# Patient Record
Sex: Female | Born: 1965 | Race: White | Hispanic: No | Marital: Married | Smoking: Never smoker
Health system: Southern US, Community
[De-identification: ages and names within clinical notes are randomized; demographics above are authoritative.]

## PROBLEM LIST (undated history)

## (undated) DIAGNOSIS — G4700 Insomnia, unspecified: Secondary | ICD-10-CM

## (undated) DIAGNOSIS — N390 Urinary tract infection, site not specified: Secondary | ICD-10-CM

## (undated) DIAGNOSIS — J189 Pneumonia, unspecified organism: Secondary | ICD-10-CM

## (undated) DIAGNOSIS — A6004 Herpesviral vulvovaginitis: Secondary | ICD-10-CM

## (undated) DIAGNOSIS — K219 Gastro-esophageal reflux disease without esophagitis: Secondary | ICD-10-CM

## (undated) DIAGNOSIS — M1990 Unspecified osteoarthritis, unspecified site: Secondary | ICD-10-CM

## (undated) DIAGNOSIS — E8881 Metabolic syndrome: Secondary | ICD-10-CM

## (undated) DIAGNOSIS — K649 Unspecified hemorrhoids: Secondary | ICD-10-CM

## (undated) DIAGNOSIS — E88819 Insulin resistance, unspecified: Secondary | ICD-10-CM

## (undated) DIAGNOSIS — M1710 Unilateral primary osteoarthritis, unspecified knee: Secondary | ICD-10-CM

## (undated) DIAGNOSIS — Z87442 Personal history of urinary calculi: Secondary | ICD-10-CM

## (undated) DIAGNOSIS — I10 Essential (primary) hypertension: Secondary | ICD-10-CM

## (undated) DIAGNOSIS — E7800 Pure hypercholesterolemia, unspecified: Secondary | ICD-10-CM

## (undated) DIAGNOSIS — C499 Malignant neoplasm of connective and soft tissue, unspecified: Secondary | ICD-10-CM

## (undated) DIAGNOSIS — M179 Osteoarthritis of knee, unspecified: Secondary | ICD-10-CM

## (undated) DIAGNOSIS — F419 Anxiety disorder, unspecified: Secondary | ICD-10-CM

## (undated) HISTORY — DX: Osteoarthritis of knee, unspecified: M17.9

## (undated) HISTORY — PX: CARPAL TUNNEL RELEASE: SHX101

## (undated) HISTORY — DX: Gastro-esophageal reflux disease without esophagitis: K21.9

## (undated) HISTORY — DX: Unspecified hemorrhoids: K64.9

## (undated) HISTORY — DX: Metabolic syndrome: E88.81

## (undated) HISTORY — DX: Anxiety disorder, unspecified: F41.9

## (undated) HISTORY — DX: Insomnia, unspecified: G47.00

## (undated) HISTORY — DX: Unspecified osteoarthritis, unspecified site: M19.90

## (undated) HISTORY — PX: TONSILLECTOMY: SUR1361

## (undated) HISTORY — DX: Essential (primary) hypertension: I10

## (undated) HISTORY — DX: Herpesviral vulvovaginitis: A60.04

## (undated) HISTORY — DX: Malignant neoplasm of connective and soft tissue, unspecified: C49.9

## (undated) HISTORY — DX: Unilateral primary osteoarthritis, unspecified knee: M17.10

## (undated) HISTORY — DX: Pure hypercholesterolemia, unspecified: E78.00

## (undated) HISTORY — DX: Urinary tract infection, site not specified: N39.0

## (undated) HISTORY — DX: Insulin resistance, unspecified: E88.819

---

## 1999-07-29 ENCOUNTER — Other Ambulatory Visit: Admission: RE | Admit: 1999-07-29 | Discharge: 1999-07-29 | Payer: Self-pay | Admitting: Obstetrics and Gynecology

## 2000-01-24 ENCOUNTER — Other Ambulatory Visit: Admission: RE | Admit: 2000-01-24 | Discharge: 2000-01-24 | Payer: Self-pay | Admitting: Obstetrics and Gynecology

## 2001-02-01 ENCOUNTER — Other Ambulatory Visit: Admission: RE | Admit: 2001-02-01 | Discharge: 2001-02-01 | Payer: Self-pay | Admitting: Obstetrics and Gynecology

## 2001-04-09 ENCOUNTER — Encounter: Payer: Self-pay | Admitting: Urology

## 2001-04-09 ENCOUNTER — Encounter: Admission: RE | Admit: 2001-04-09 | Discharge: 2001-04-09 | Payer: Self-pay | Admitting: Urology

## 2001-05-02 ENCOUNTER — Encounter: Payer: Self-pay | Admitting: Urology

## 2001-05-02 ENCOUNTER — Ambulatory Visit (HOSPITAL_COMMUNITY): Admission: RE | Admit: 2001-05-02 | Discharge: 2001-05-02 | Payer: Self-pay | Admitting: Urology

## 2001-05-16 ENCOUNTER — Encounter: Payer: Self-pay | Admitting: Urology

## 2001-05-16 ENCOUNTER — Ambulatory Visit (HOSPITAL_COMMUNITY): Admission: RE | Admit: 2001-05-16 | Discharge: 2001-05-16 | Payer: Self-pay | Admitting: Urology

## 2009-07-23 ENCOUNTER — Ambulatory Visit (HOSPITAL_BASED_OUTPATIENT_CLINIC_OR_DEPARTMENT_OTHER): Admission: RE | Admit: 2009-07-23 | Discharge: 2009-07-23 | Payer: Self-pay | Admitting: Orthopedic Surgery

## 2009-09-03 ENCOUNTER — Ambulatory Visit (HOSPITAL_BASED_OUTPATIENT_CLINIC_OR_DEPARTMENT_OTHER): Admission: RE | Admit: 2009-09-03 | Discharge: 2009-09-03 | Payer: Self-pay | Admitting: Orthopedic Surgery

## 2010-07-17 HISTORY — PX: APPENDECTOMY: SHX54

## 2010-10-02 LAB — BASIC METABOLIC PANEL
BUN: 11 mg/dL (ref 6–23)
CO2: 27 mEq/L (ref 19–32)
Calcium: 9.2 mg/dL (ref 8.4–10.5)
Chloride: 101 mEq/L (ref 96–112)
Creatinine, Ser: 0.8 mg/dL (ref 0.4–1.2)
GFR calc Af Amer: >60 mL/min (ref >60)
GFR calc non Af Amer: >60 mL/min (ref >60)
Glucose, Bld: 125 mg/dL — ABNORMAL HIGH (ref 70–99)
Potassium: 4.4 mEq/L (ref 3.5–5.1)
Sodium: 137 mEq/L (ref 135–145)

## 2010-10-02 LAB — POCT HEMOGLOBIN-HEMACUE: Hemoglobin: 12.1 g/dL (ref 12.0–15.0)

## 2010-10-07 LAB — POCT I-STAT, CHEM 8
BUN: 11 mg/dL (ref 6–23)
Calcium, Ion: 1.18 mmol/L (ref 1.12–1.32)
Chloride: 105 mEq/L (ref 96–112)
Creatinine, Ser: 0.8 mg/dL (ref 0.4–1.2)
Glucose, Bld: 103 mg/dL — ABNORMAL HIGH (ref 70–99)
HCT: 36 % (ref 36.0–46.0)
Hemoglobin: 12.2 g/dL (ref 12.0–15.0)
Potassium: 3.9 mEq/L (ref 3.5–5.1)
Sodium: 139 mEq/L (ref 135–145)
TCO2: 25 mmol/L (ref 0–100)

## 2015-01-13 HISTORY — PX: ESOPHAGOGASTRODUODENOSCOPY: SHX1529

## 2016-11-20 SURGERY — LITHOTRIPSY, ESWL
Anesthesia: LOCAL | Laterality: Left

## 2017-04-25 DIAGNOSIS — Z01818 Encounter for other preprocedural examination: Secondary | ICD-10-CM | POA: Diagnosis not present

## 2017-08-29 ENCOUNTER — Ambulatory Visit: Payer: Commercial Managed Care - PPO | Admitting: Allergy and Immunology

## 2017-08-29 ENCOUNTER — Encounter: Payer: Self-pay | Admitting: Allergy and Immunology

## 2017-08-29 VITALS — BP 148/90 | HR 80 | Temp 98.5°F | Resp 20 | Ht 62.3 in | Wt 207.8 lb

## 2017-08-29 DIAGNOSIS — R05 Cough: Secondary | ICD-10-CM

## 2017-08-29 DIAGNOSIS — K219 Gastro-esophageal reflux disease without esophagitis: Secondary | ICD-10-CM

## 2017-08-29 DIAGNOSIS — R059 Cough, unspecified: Secondary | ICD-10-CM

## 2017-08-29 MED ORDER — RANITIDINE HCL 300 MG PO TABS: 300.0000 mg | ORAL_TABLET | Freq: Every day | ORAL | 5 refills | Status: DC

## 2017-08-29 MED ORDER — METHYLPREDNISOLONE ACETATE 80 MG/ML IJ SUSP
80.0000 mg | Freq: Once | INTRAMUSCULAR | Status: AC
  Administered 2017-08-29: 80 mg via INTRAMUSCULAR

## 2017-08-29 MED ORDER — HYDROCOD POLST-CPM POLST ER 10-8 MG/5ML PO SUER: ORAL | 0 refills | Status: DC

## 2017-08-29 NOTE — Progress Notes (Signed)
Follow-up Note  Referring Provider: Ernestene Kiel, MD Primary Provider: Ernestene Kiel, MD Date of Office Visit: 08/29/2017  Subjective:   Brittany Lee (DOB: 04/17/1966) is a 52 y.o. female who returns to the [facility] [place] on 08/29/2017 in re-evaluation of the following:  HPI: Brittany Lee returns to this clinic in evaluation of her LPR and distant history of asthma.  She has not been seen in this clinic for several years.  Apparently she was doing very well without any significant problems but this past weekend she had a very significant reflux event with regurgitation and burning in her chest and burning in her throat leading her to develop an unrelenting coughing episode with lots of throat clearing that has not cleared.  She does not have any associated fever or ugly sputum production or chest pain or significant upper airway symptoms with this episode.  She uses Protonix 1 time per day to treat her reflux and occasionally will increase to twice a day for the most part she thought that she was doing very well with her condition until this event.  Allergies as of 08/29/2017      Reactions   Bupropion Other (See Comments)   "cried very easily"   Topiramate Other (See Comments)   "mental fogginess"   Sulfamethoxazole Rash      Medication List      ALPRAZolam 1 MG tablet Commonly known as:  XANAX   celecoxib 200 MG capsule Commonly known as:  CELEBREX   fenofibrate 145 MG tablet Commonly known as:  TRICOR   metFORMIN 500 MG 24 hr tablet Commonly known as:  GLUCOPHAGE-XR   pantoprazole 40 MG tablet Commonly known as:  PROTONIX   sertraline 50 MG tablet Commonly known as:  ZOLOFT   triamterene-hydrochlorothiazide 37.5-25 MG tablet Commonly known as:  MAXZIDE-25   zolpidem 12.5 MG CR tablet Commonly known as:  AMBIEN CR       Past Medical History:  Diagnosis Date  . Acid reflux   . Insulin resistance   . Osteoarthritis of knee     Past  Surgical History:  Procedure Laterality Date  . APPENDECTOMY  2012  . CARPAL TUNNEL RELEASE Bilateral   . [place]  . TONSILLECTOMY      Review of systems negative except as noted in HPI / PMHx or noted below:  Review of Systems  Constitutional: Negative.   HENT: Negative.   Eyes: Negative.   Respiratory: Negative.   Cardiovascular: Negative.   Gastrointestinal: Negative.   Genitourinary: Negative.   Musculoskeletal: Negative.   Skin: Negative.   Neurological: Negative.   Endo/Heme/Allergies: Negative.   Psychiatric/Behavioral: Negative.      Objective:   Vitals:   08/29/17 1525  BP: (!) 148/90  Pulse: 80  Resp: 20  Temp: 98.5 F (36.9 C)   Height: 5' 2.3" (158.2 cm)  Weight: 207 lb 12.8 oz (94.3 kg)   Physical Exam  Constitutional: She is well-developed, well-nourished, and in no distress.  Coughing  HENT:  Head: Normocephalic.  Right Ear: Tympanic membrane, external ear and ear canal normal.  Left Ear: Tympanic membrane, external ear and ear canal normal.  Nose: Nose normal. No mucosal edema or rhinorrhea.  Mouth/Throat: Uvula is midline, oropharynx is clear and moist and mucous membranes are normal. No oropharyngeal exudate.  Eyes: Conjunctivae are normal.  Neck: Trachea normal. No tracheal tenderness present. No tracheal deviation present. No thyromegaly present.  Cardiovascular: Normal rate, regular rhythm, S1  normal, S2 normal and normal heart sounds.  No murmur heard. Pulmonary/Chest: Breath sounds normal. No stridor. No respiratory distress. She has no wheezes. She has no rales.  Musculoskeletal: She exhibits no edema.  Lymphadenopathy:       Head (right side): No tonsillar adenopathy present.       Head (left side): No tonsillar adenopathy present.    She has no cervical adenopathy.  Neurological: She is alert. Gait normal.  Skin: No rash noted. She is not diaphoretic. No erythema. Nails show no clubbing.  Psychiatric: Mood and  affect normal.    Diagnostics: none  Assessment and Plan:   1. LPRD (laryngopharyngeal reflux disease)   2. Cough     1.  Increase treatment for reflux:   A.  Use Protonix 40 mg twice a day  B.  Add ranitidine 300 mg in evening  C.  Minimize use of caffeine and chocolate  2.  Treat inflammation:   A.  Depo-Medrol 80 IM delivered in clinic today  3.  If needed:   A.  Tussionex 2.5-5.0 mL's every 12 hours for cough.  NARCOTIC: 13ml  4.  Further evaluation and treatment?  I will assume that Liboria has developed a significant acid induced insult to her mid airway and treat her with therapy as mentioned above and she will keep in contact with me noting her response to this approach.  Further evaluation and treatment will be based upon her response.  Allena Katz, MD Allergy / Immunology [place]

## 2017-08-29 NOTE — Patient Instructions (Addendum)
  1.  Increase treatment for reflux:   A.  Use Protonix 40 mg twice a day  B.  Add ranitidine 300 mg in evening  C.  Minimize use of caffeine and chocolate  2.  Treat inflammation:   A.  Depo-Medrol 80 IM delivered in clinic today  3.  If needed:   A.  Tussionex 2.5-5.0 mL's every 12 hours for cough.  NARCOTIC: 13ml  4.  Further evaluation and treatment?

## 2017-08-30 ENCOUNTER — Encounter: Payer: Self-pay | Admitting: Allergy and Immunology

## 2017-09-10 ENCOUNTER — Telehealth: Payer: Self-pay | Admitting: *Deleted

## 2017-09-10 NOTE — Telephone Encounter (Signed)
Brittany Lee called to state that she is still having a pretty constant cough. There has been some improvement but it definitely has not gone away. She is taking the Protonix BID and Zantac QD as directed. She would like to know if she needs to give it more time or if there is anything else she should do/take. Please advise.

## 2017-09-10 NOTE — Telephone Encounter (Signed)
Please inform patient that we can give her prednisone 10 mg tablet once a day for an additional 10 days assuming that there is still some significant inflammation of her airway as a result of her insult that occurred several weeks ago.  As long as she continues to improve I would recommend she continue to utilize the plan established during her clinic visit.  If for some reason she gets worse or develops new symptoms then she should let us know and we may need to approach this issue differently.

## 2017-09-10 NOTE — Telephone Encounter (Signed)
Patient informed, I will leave Prednisone up front for her to pick up.

## 2017-11-02 HISTORY — PX: COLONOSCOPY: SHX174

## 2017-11-30 ENCOUNTER — Encounter: Payer: Self-pay | Admitting: Gastroenterology

## 2018-03-17 HISTORY — PX: REPLACEMENT TOTAL KNEE: SUR1224

## 2018-03-25 ENCOUNTER — Ambulatory Visit: Payer: Commercial Managed Care - PPO | Admitting: Allergy and Immunology

## 2018-03-25 ENCOUNTER — Encounter: Payer: Self-pay | Admitting: Allergy and Immunology

## 2018-03-25 VITALS — BP 138/84 | HR 80 | Resp 18

## 2018-03-25 DIAGNOSIS — K219 Gastro-esophageal reflux disease without esophagitis: Secondary | ICD-10-CM | POA: Diagnosis not present

## 2018-03-25 NOTE — Patient Instructions (Signed)
  1.  Continue treatment for reflux:   A.  Use Protonix 40 mg 1-2 times a day depending on reflux activity  B.  Add ranitidine 300 mg in evening when needed  C.  Minimize use of caffeine and chocolate  2.  Return to clinic in 1 year or earlier if problem

## 2018-03-25 NOTE — Progress Notes (Signed)
Follow-up Note  Referring Provider: Ernestene Kiel, MD Primary Provider: Ernestene Kiel, MD Date of Office Visit: 03/25/2018  Subjective:   Brittany Lee (DOB: 1966/04/14) is a 52 y.o. female who returns to the All[facility] and [place] on 03/25/2018 in re-evaluation of the following:  HPI: [place] returns to this clinic in reevaluation of her LPR and very distant history of asthma.  She was last seen in this clinic 29 August 2017.  Unfortunately she had a bad reflux episode about 2 weeks ago and subsequently developed coughing.  She immediately restarted her ranitidine in the evening and increased her Protonix to twice a day and she is much better at this point in time although still has a little bit of a lingering cough.  Overall though she is done very well on Protonix 1 time per day if she is careful about caffeine and chocolate consumption.  Allergies as of 03/25/2018      Reactions   Bupropion Other (See Comments)   "cried very easily"   Topiramate Other (See Comments)   "mental fogginess"   Sulfamethoxazole Rash      Medication List      acyclovir 400 MG tablet Commonly known as:  ZOVIRAX acyclovir 400 mg tablet  TAKE 1 TABLET TWICE A DAY   ALPRAZolam 1 MG tablet Commonly known as:  XANAX   fenofibrate 145 MG tablet Commonly known as:  TRICOR Take 145 mg by mouth daily.   metFORMIN 500 MG 24 hr tablet Commonly known as:  GLUCOPHAGE-XR Take 500 mg by mouth daily.   MULTIVITAMIN PO Take by mouth.   pantoprazole 40 MG tablet Commonly known as:  PROTONIX Take 40 mg by mouth 2 (two) times daily.   ranitidine 300 MG tablet Commonly known as:  ZANTAC Take 1 tablet (300 mg total) by mouth at bedtime.   sertraline 50 MG tablet Commonly known as:  ZOLOFT Take 50 mg by mouth daily.   traMADol 50 MG tablet Commonly known as:  ULTRAM   triamterene-hydrochlorothiazide 37.5-25 MG tablet Commonly known as:  MAXZIDE-25 as needed.   zolpidem 12.5 MG CR  tablet Commonly known as:  AMBIEN CR       Past Medical History:  Diagnosis Date  . Acid reflux   . Insulin resistance   . Osteoarthritis of knee     Past Surgical History:  Procedure Laterality Date  . APPENDECTOMY  2012  . CARPAL TUNNEL RELEASE Bilateral   . [place]  . TONSILLECTOMY      Review of systems negative except as noted in HPI / PMHx or noted below:  Review of Systems  Constitutional: Negative.   HENT: Negative.   Eyes: Negative.   Respiratory: Negative.   Cardiovascular: Negative.   Gastrointestinal: Negative.   Genitourinary: Negative.   Musculoskeletal: Negative.   Skin: Negative.   Neurological: Negative.   Endo/Heme/Allergies: Negative.   Psychiatric/Behavioral: Negative.      Objective:   Vitals:   03/25/18 1638  BP: 138/84  Pulse: 80  Resp: 18          Physical Exam  HENT:  Head: Normocephalic.  Right Ear: Tympanic membrane, external ear and ear canal normal.  Left Ear: Tympanic membrane, external ear and ear canal normal.  Nose: Nose normal. No mucosal edema or rhinorrhea.  Mouth/Throat: Uvula is midline, oropharynx is clear and moist and mucous membranes are normal. No oropharyngeal exudate.  Eyes: Conjunctivae are normal.  Neck: Trachea normal. No tracheal  tenderness present. No tracheal deviation present. No thyromegaly present.  Cardiovascular: Normal rate, regular rhythm, S1 normal, S2 normal and normal heart sounds.  No murmur heard. Pulmonary/Chest: Breath sounds normal. No stridor. No respiratory distress. She has no wheezes. She has no rales.  Musculoskeletal: She exhibits no edema.  Lymphadenopathy:       Head (right side): No tonsillar adenopathy present.       Head (left side): No tonsillar adenopathy present.    She has no cervical adenopathy.  Neurological: She is alert.  Skin: No rash noted. She is not diaphoretic. No erythema. Nails show no clubbing.    Diagnostics: none  Assessment and  Plan:   1. LPRD (laryngopharyngeal reflux disease)     1.  Continue treatment for reflux:   A.  Use Protonix 40 mg 1-2 times a day depending on reflux activity  B.  Add ranitidine 300 mg in evening when needed  C.  Minimize use of caffeine and chocolate  2.  Return to clinic in 1 year or earlier if problem   Charnese will probably do well as long as she is very careful about precipitating reflux episodes and uses aggressive therapy directed against reflux to address her LPR.  I will see her back in this clinic in 1 year or earlier if there is a problem.  Allena Katz, MD Allergy / Immunology [place]

## 2018-03-26 ENCOUNTER — Encounter: Payer: Self-pay | Admitting: Allergy and Immunology

## 2018-03-26 ENCOUNTER — Ambulatory Visit: Payer: Commercial Managed Care - PPO | Admitting: Allergy

## 2018-03-28 DIAGNOSIS — Z01818 Encounter for other preprocedural examination: Secondary | ICD-10-CM

## 2018-07-05 ENCOUNTER — Encounter: Payer: Self-pay | Admitting: Allergy

## 2018-07-05 ENCOUNTER — Ambulatory Visit: Payer: Commercial Managed Care - PPO | Admitting: Allergy

## 2018-07-05 VITALS — BP 128/94 | HR 96 | Temp 98.3°F | Resp 16 | Ht 63.0 in | Wt 204.4 lb

## 2018-07-05 DIAGNOSIS — K219 Gastro-esophageal reflux disease without esophagitis: Secondary | ICD-10-CM | POA: Insufficient documentation

## 2018-07-05 DIAGNOSIS — R05 Cough: Secondary | ICD-10-CM

## 2018-07-05 DIAGNOSIS — J069 Acute upper respiratory infection, unspecified: Secondary | ICD-10-CM | POA: Diagnosis not present

## 2018-07-05 DIAGNOSIS — R059 Cough, unspecified: Secondary | ICD-10-CM

## 2018-07-05 MED ORDER — HYDROCOD POLST-CPM POLST ER 10-8 MG/5ML PO SUER: ORAL | 0 refills | Status: DC

## 2018-07-05 MED ORDER — PANTOPRAZOLE SODIUM 40 MG PO TBEC: DELAYED_RELEASE_TABLET | ORAL | 0 refills | Status: DC

## 2018-07-05 NOTE — Patient Instructions (Addendum)
Take Tussionex 2.71ml to 5.4ml every 12 hours as needed for severe coughing.    Continue treatment for reflux:             A.  Use Protonix 40 mg 1-2 times a day depending on reflux activity             B.  Add ranitidine 300 mg in evening when needed             C.  Minimize use of caffeine and chocolate  Follow up in 1 year.   Drink plenty of fluids.  Water, juice, clear broth or warm lemon water are good choices. Avoid caffeine and alcohol, which can dehydrate you.  Eat chicken soup.  Chicken soup and other warm fluids can be soothing and loosen congestion.  Rest.  Adjust your room's temperature and humidity.  Keep your room warm but not overheated. If the air is dry, a cool-mist humidifier or vaporizer can moisten the air and help ease congestion and coughing. Keep the humidifier clean to prevent the growth of bacteria and molds.  Soothe your throat.  Perform a saltwater gargle. Dissolve one-quarter to a half teaspoon of salt in a 4- to 8-ounce glass of warm water. This can relieve a sore or scratchy throat temporarily.  Use saline nasal drops.  To help relieve nasal congestion, try saline nasal drops. You can buy these drops over the counter, and they can help relieve symptoms ? even in children.  Take over-the-counter cold and cough medications.  For adults and children older than 5, over-the-counter decongestants, antihistamines and pain relievers might offer some symptom relief. However, they won't prevent a cold or shorten its duration.

## 2018-07-05 NOTE — Assessment & Plan Note (Signed)
Patient most likely has a viral URI which is aggravating her coughing.  Unable to perform proper spirometry today due to coughing. Lungs clear on exam.   Gave hand out on viral URI  Take Tussionex 2.75ml to 5.25ml every 12 hours as needed for severe coughing.

## 2018-07-05 NOTE — Assessment & Plan Note (Signed)
.   See assessment and plan as above. 

## 2018-07-05 NOTE — Assessment & Plan Note (Signed)
Had some flare a few weeks ago.  Use Protonix 40 mg 1-2 times a day depending on reflux activity  Add ranitidine 300 mg in evening when needed  Minimize use of caffeine and chocolate

## 2018-07-05 NOTE — Progress Notes (Signed)
Follow Up Note  RE: Brittany Lee MRN: [id number] DOB: 1966-06-02 Date of Office Visit: 07/05/2018  Referring provider: Ernestene Kiel, MD Primary care provider: Ernestene Kiel, MD  Chief Complaint: Cough; Gastroesophageal Reflux; and Nasal Congestion  History of Present Illness: I had the pleasure of seeing Brittany Lee for a follow up visit at the [facility] [place] [facility] [place] on 07/05/2018. She is a 52 y.o. female, who is being followed for LRPD and cough. Today she is here for new complaint of cough. Her previous allergy office visit was on 03/25/2018 with Dr. Neldon Mc.   A few weeks ago patient had really bad reflux at night and on Tuesday she started with scratchy throat, sinus congestion and cough. The cough is keeping her up at night. She wants tussionex as that is the only thing that has helped her cough at night.   Patient currently on Protonix 40mg  daily and takes it twice a day during flares. She is also taking ranitidine 300mg  daily.  She also started Flonase 1 spray twice a day this week with some benefit. Denies any fevers or chills. No sick contacts.   Assessment and Plan: [name] is a 52 y.o. female with: Cough Patient most likely has a viral URI which is aggravating her coughing.  Unable to perform proper spirometry today due to coughing. Lungs clear on exam.   Gave hand out on viral URI  Take Tussionex 2.75ml to 5.67ml every 12 hours as needed for severe coughing.  Viral upper respiratory infection See assessment and plan as above.   LPRD (laryngopharyngeal reflux disease) Had some flare a few weeks ago.  Use Protonix 40 mg 1-2 times a day depending on reflux activity  Add ranitidine 300 mg in evening when needed  Minimize use of caffeine and chocolate  Return in about 1 year (around 07/06/2019).  Meds ordered this encounter  Medications  . pantoprazole (PROTONIX) 40 MG tablet    Sig: One tablet twice a day x's 1 month for reflux    Dispense:  60  tablet    Refill:  0  . chlorpheniramine-HYDROcodone (TUSSIONEX PENNKINETIC ER) 10-8 MG/5ML SUER    Sig: Take 2.38ml to 5.24ml every 12 hours as needed for coughing.    Dispense:  60 mL    Refill:  0   Diagnostics: Spirometry:  Tracings reviewed. Her effort: It was hard to get consistent efforts and there is a question as to whether this reflects a maximal maneuver. FVC: 2.81L FEV1: 2.20L, 82% predicted FEV1/FVC ratio: 78% Interpretation: not able to fully interpret due to today's poor effort and coughing.  Please see scanned spirometry results for details.  Medication List:  Current Outpatient Medications  Medication Sig Dispense Refill  . acyclovir (ZOVIRAX) 400 MG tablet Take 400 mg by mouth.     . ALPRAZolam (XANAX) 1 MG tablet     . fenofibrate (TRICOR) 145 MG tablet Take 145 mg by mouth daily.     . hydrochlorothiazide (MICROZIDE) 12.5 MG capsule Take 12.5 mg by mouth daily.    . metFORMIN (GLUCOPHAGE-XR) 500 MG 24 hr tablet Take 500 mg by mouth daily.     . Multiple Vitamins-Minerals (MULTIVITAMIN PO) Take by mouth.    . pantoprazole (PROTONIX) 40 MG tablet Take 40 mg by mouth 2 (two) times daily.     . ranitidine (ZANTAC) 300 MG tablet Take 1 tablet (300 mg total) by mouth at bedtime. 30 tablet 5  . sertraline (ZOLOFT) 50 MG tablet Take 50  mg by mouth daily.     . traMADol (ULTRAM) 50 MG tablet     . zolpidem (AMBIEN CR) 12.5 MG CR tablet     . chlorpheniramine-HYDROcodone (TUSSIONEX PENNKINETIC ER) 10-8 MG/5ML SUER Take 2.42ml to 5.45ml every 12 hours as needed for coughing. 60 mL 0  . pantoprazole (PROTONIX) 40 MG tablet One tablet twice a day x's 1 month for reflux 60 tablet 0   No current facility-administered medications for this visit.    Allergies: Allergies  Allergen Reactions  . Bupropion Other (See Comments)    "cried very easily"  . Sulfa Antibiotics Other (See Comments)  . Topiramate Other (See Comments)    "mental fogginess"  . Sulfamethoxazole Rash   I  reviewed her past medical history, social history, family history, and environmental history and no significant changes have been reported from previous visit on 03/25/2018.  Review of Systems  Constitutional: Negative for appetite change, chills, fever and unexpected weight change.  HENT: Positive for congestion. Negative for rhinorrhea.   Eyes: Negative for itching.  Respiratory: Positive for cough. Negative for chest tightness, shortness of breath and wheezing.   Gastrointestinal: Negative for abdominal pain.  Skin: Negative for rash.  Neurological: Negative for headaches.   Objective: BP (!) 128/94 (BP Location: Left Arm, Patient Position: Sitting, Cuff Size: Normal)   Pulse 96   Temp 98.3 F (36.8 C) (Oral)   Resp 16   Ht 5\' 3"  (1.6 m)   Wt 204 lb 5.9 oz (92.7 kg)   SpO2 96%   BMI 36.20 kg/m  Body mass index is 36.2 kg/m. Physical Exam  Constitutional: She is oriented to person, place, and time. She appears well-developed and well-nourished.  HENT:  Head: Normocephalic and atraumatic.  Right Ear: External ear normal.  Left Ear: External ear normal.  Nose: Nose normal.  Mouth/Throat: Oropharynx is clear and moist.  Eyes: Conjunctivae and EOM are normal.  Neck: Neck supple.  Cardiovascular: Normal rate, regular rhythm and normal heart sounds. Exam reveals no gallop and no friction rub.  No murmur heard. Pulmonary/Chest: Effort normal and breath sounds normal. She has no wheezes. She has no rales.  Coughing during exam  Lymphadenopathy:    She has no cervical adenopathy.  Neurological: She is alert and oriented to person, place, and time.  Skin: Skin is warm. No rash noted.  Psychiatric: She has a normal mood and affect. Her behavior is normal.  Nursing note and vitals reviewed.  Previous notes and tests were reviewed. The plan was reviewed with the patient/family, and all questions/concerned were addressed.  It was my pleasure to see Brittany Lee today and participate in her  care. Please feel free to contact me with any questions or concerns.  Sincerely,  Rexene Alberts, DO Allergy & Immunology  [facility] office: [phone] [facility] office: [phone]

## 2018-07-08 ENCOUNTER — Ambulatory Visit: Payer: Self-pay | Admitting: Allergy

## 2018-07-12 ENCOUNTER — Ambulatory Visit: Payer: Self-pay | Admitting: Allergy

## 2018-10-04 ENCOUNTER — Other Ambulatory Visit: Payer: Self-pay | Admitting: General Surgery

## 2018-10-07 NOTE — Pre-Procedure Instructions (Signed)
Brittany Lee  10/07/2018      [facility] - [place], [place] - [address] [place] [address] Phone: [phone] Fax: [phone]    Your procedure is scheduled on 10/10/18 Thursday from 0730-1030 AM  Report to [facility] at 5:30 A.M.  Call this number if you have problems the morning of surgery:  [phone]   Remember:  Do not eat after midnight.  You may drink clear liquids until  0430 AM.  Clear liquids allowed are: Water, Juice (non-citric and without pulp), Carbonated beverages, Clear Tea, Black Coffee only, Plain Jell-O only, Gatorade and Plain Popsicles only. Please complete your PRE-SURGERY ENSURE that was provided to you, 2 bottles by 11pm night before surgery and 1 bottle  3 hours (0430 AM) day of surgery .  Please, if able, drink it in one setting. DO NOT SIP.    Take these medicines the morning of surgery with A SIP OF WATER :              Fenofibrate(TRICOR)              Pantoprazole (Protonix)              Sertraline(ZOLOFT)                As needed - Alprazolam (XANAX)  As of today, STOP taking any Aspirin (unless otherwise instructed by your surgeon), Aleve, Naproxen, Ibuprofen, Motrin, Advil, Goody's, BC's, all herbal medications, fish oil, and all vitamins.    How to Manage Your Diabetes Before and After Surgery  Why is it important to control my blood sugar before and after surgery? . Improving blood sugar levels before and after surgery helps healing and can limit problems. . A way of improving blood sugar control is eating a healthy diet by: o  Eating less sugar and carbohydrates o  Increasing activity/exercise o  Talking with your doctor about reaching your blood sugar goals . High blood sugars (greater than 180 mg/dL) can raise your risk of infections and slow your recovery, so you will need to focus on controlling your diabetes during the weeks before surgery. . Make sure that the doctor  who takes care of your diabetes knows about your planned surgery including the date and location.  How do I manage my blood sugar before surgery? . Check your blood sugar at least 4 times a day, starting 2 days before surgery, to make sure that the level is not too high or low. o Check your blood sugar the morning of your surgery when you wake up and every 2 hours until you get to the Short Stay unit. . If your blood sugar is less than 70 mg/dL, you will need to treat for low blood sugar: o Do not take insulin. o Treat a low blood sugar (less than 70 mg/dL) with  cup of clear juice (cranberry or apple), 4 glucose tablets, OR glucose gel. Recheck blood sugar in 15 minutes after treatment (to make sure it is greater than 70 mg/dL). If your blood sugar is not greater than 70 mg/dL on recheck, call [phone] o  for further instructions. . Report your blood sugar to the short stay nurse when you get to Short Stay.  . If you are admitted to the hospital after surgery: o Your blood sugar will be checked by the staff and you will probably be given insulin after surgery (instead of oral diabetes medicines) to make  sure you have good blood sugar levels. o The goal for blood sugar control after surgery is 80-180 mg/dL.  WHAT DO I DO ABOUT MY DIABETES MEDICATION?  Marland Kitchen Do not take oral diabetes medicines (pills) METFORMIN the morning of surgery.  If your CBG is greater than 220 mg/dL, you may take  of your sliding scale (correction) dose of insuli  Reviewed and Endorsed by [facility] Patient Education Committee, August 2015   Do not wear jewelry, make-up or nail polish.  Do not wear lotions, powders, or perfumes, or deodorant.  Do not shave 48 hours prior to surgery.  Men may shave face and neck.  Do not bring valuables to the hospital.  [facility] is not responsible for any belongings or valuables.  Contacts, dentures or bridgework may not be worn into surgery.  Leave your suitcase in the car.   After surgery it may be brought to your room.  For patients admitted to the hospital, discharge time will be determined by your treatment team.  Patients discharged the day of surgery will not be allowed to drive home.    Special instructions:   [place]- Preparing For Surgery  Before surgery, you can play an important role. Because skin is not sterile, your skin needs to be as free of germs as possible. You can reduce the number of germs on your skin by washing with CHG (chlorahexidine gluconate) Soap before surgery.  CHG is an antiseptic cleaner which kills germs and bonds with the skin to continue killing germs even after washing.    Oral Hygiene is also important to reduce your risk of infection.  Remember - BRUSH YOUR TEETH THE MORNING OF SURGERY WITH YOUR REGULAR TOOTHPASTE  Please do not use if you have an allergy to CHG or antibacterial soaps. If your skin becomes reddened/irritated stop using the CHG.  Do not shave (including legs and underarms) for at least 48 hours prior to first CHG shower. It is OK to shave your face.  Please follow these instructions carefully.   1. Shower the NIGHT BEFORE SURGERY and the MORNING OF SURGERY with CHG.   2. If you chose to wash your hair, wash your hair first as usual with your normal shampoo.  3. After you shampoo, rinse your hair and body thoroughly to remove the shampoo.  4. Use CHG as you would any other liquid soap. You can apply CHG directly to the skin and wash gently with a scrungie or a clean washcloth.   5. Apply the CHG Soap to your body ONLY FROM THE NECK DOWN.  Do not use on open wounds or open sores. Avoid contact with your eyes, ears, mouth and genitals (private parts). Wash Face and genitals (private parts)  with your normal soap.  6. Wash thoroughly, paying special attention to the area where your surgery will be performed.  7. Thoroughly rinse your body with warm water from the neck down.  8. DO NOT shower/wash with  your normal soap after using and rinsing off the CHG Soap.  9. Pat yourself dry with a CLEAN TOWEL.  10. Wear CLEAN PAJAMAS to bed the night before surgery, wear comfortable clothes the morning of surgery  11. Place CLEAN SHEETS on your bed the night of your first shower and DO NOT SLEEP WITH PETS.  Day of Surgery:  Do not apply any deodorants/lotions.  Please wear clean clothes to the hospital/surgery center.   Remember to brush your teeth WITH YOUR REGULAR TOOTHPASTE.  Please read over the following fact sheets that you were given. Pain Booklet and Surgical Site Infection Prevention

## 2018-10-08 ENCOUNTER — Encounter (HOSPITAL_COMMUNITY): Payer: Self-pay

## 2018-10-08 ENCOUNTER — Other Ambulatory Visit: Payer: Self-pay

## 2018-10-08 ENCOUNTER — Encounter (HOSPITAL_COMMUNITY)
Admission: RE | Admit: 2018-10-08 | Discharge: 2018-10-08 | Disposition: A | Discharge status: Home / Self Care | Payer: Commercial Managed Care - PPO | Source: Ambulatory Visit | Attending: General Surgery | Admitting: General Surgery

## 2018-10-08 DIAGNOSIS — Z01818 Encounter for other preprocedural examination: Secondary | ICD-10-CM | POA: Insufficient documentation

## 2018-10-08 HISTORY — DX: Pneumonia, unspecified organism: J18.9

## 2018-10-08 HISTORY — DX: Personal history of urinary calculi: Z87.442

## 2018-10-08 LAB — COMPREHENSIVE METABOLIC PANEL
ALK PHOS: 49 U/L (ref 38–126)
ALT: 20 U/L (ref 0–44)
AST: 19 U/L (ref 15–41)
Albumin: 4 g/dL (ref 3.5–5.0)
Anion gap: 5 (ref 5–15)
BUN: 15 mg/dL (ref 6–20)
CO2: 26 mmol/L (ref 22–32)
CREATININE: 0.81 mg/dL (ref 0.44–1.00)
Calcium: 9.6 mg/dL (ref 8.9–10.3)
Chloride: 104 mmol/L (ref 98–111)
GFR calc non Af Amer: >60 mL/min (ref >60)
Glucose, Bld: 108 mg/dL — ABNORMAL HIGH (ref 70–99)
Potassium: 3.6 mmol/L (ref 3.5–5.1)
SODIUM: 135 mmol/L (ref 135–145)
Total Bilirubin: 0.7 mg/dL (ref 0.3–1.2)
Total Protein: 7.1 g/dL (ref 6.5–8.1)

## 2018-10-08 LAB — ABO/RH: ABO/RH(D): O POS

## 2018-10-08 LAB — CBC WITH DIFFERENTIAL/PLATELET
Abs Immature Granulocytes: 0.02 10*3/uL (ref 0.00–0.07)
Basophils Absolute: 0 10*3/uL (ref 0.0–0.1)
Basophils Relative: 1 %
Eosinophils Absolute: 0.1 10*3/uL (ref 0.0–0.5)
Eosinophils Relative: 1 %
HCT: 36.4 % (ref 36.0–46.0)
Hemoglobin: 11.4 g/dL — ABNORMAL LOW (ref 12.0–15.0)
Immature Granulocytes: 0 %
Lymphocytes Relative: 20 %
Lymphs Abs: 1.3 10*3/uL (ref 0.7–4.0)
MCH: 28 pg (ref 26.0–34.0)
MCHC: 31.3 g/dL (ref 30.0–36.0)
MCV: 89.4 fL (ref 80.0–100.0)
Monocytes Absolute: 0.6 10*3/uL (ref 0.1–1.0)
Monocytes Relative: 9 %
NEUTROS ABS: 4.6 10*3/uL (ref 1.7–7.7)
Neutrophils Relative %: 69 %
Platelets: 392 10*3/uL (ref 150–400)
RBC: 4.07 MIL/uL (ref 3.87–5.11)
RDW: 12.3 % (ref 11.5–15.5)
WBC: 6.6 10*3/uL (ref 4.0–10.5)
nRBC: 0 % (ref 0.0–0.2)

## 2018-10-08 LAB — TYPE AND SCREEN
ABO/RH(D): O POS
Antibody Screen: NEGATIVE

## 2018-10-08 LAB — PROTIME-INR
INR: 1 (ref 0.8–1.2)
Prothrombin Time: 13.4 seconds (ref 11.4–15.2)

## 2018-10-08 LAB — GLUCOSE, CAPILLARY: Glucose-Capillary: 103 mg/dL — ABNORMAL HIGH (ref 70–99)

## 2018-10-08 NOTE — Progress Notes (Addendum)
PCP - Prochnal  Cardiologist - denies  Chest x-ray - NA  EKG - 10/08/18 (E)  Stress Test - Denies  ECHO - denies  Cardiac Cath - Denies  AICD-Denies PM-Denies LOOP-Denies  Denies CPAP - NA  LABS-CBC,CMP,INR,T/S,UA    Urine Preg DOS  ASA-NA  HA1C- Fasting Blood Sugar -  Checks Blood Sugar _0____ times a day. 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 Does not check her bld. sugar at home. Stopped taking Metformin 1 wk back. A1C tested on 10/02/18 was 5.9.(lab values pulled up on her phone, verified by [facility]) Pt. Will bring copy of her A1C on DOS  Anesthesia-Y (Abnormal EKG. No cardiac history prior)  Pt denies having chest pain, sob, or fever at this time. All instructions explained to the pt, with a verbal understanding of the material. Pt agrees to go over the instructions while at home for a better understanding. The opportunity to ask questions was provided.

## 2018-10-08 NOTE — Progress Notes (Signed)

## 2018-10-08 NOTE — Progress Notes (Signed)
Verbal ok given to RN by Dr. Barry Dienes for patient to take 2 bottles of pre-surgical Ensure the day before and 1 bottle of Ensure on the day of surgery . Dr Barry Dienes informed of pt. having been on Metformin with last dose taken 1 wk back.  Pt. Had A1C drawn on 10/02/18 at PCP office. It was 5.9. Pt. Informed RN she will bring a copy of her A1C on DOS.

## 2018-10-09 MED ORDER — CEFOTETAN DISODIUM-DEXTROSE 2-2.08 GM-%(50ML) IV SOLR
2.0000 g | INTRAVENOUS | Status: AC
  Administered 2018-10-10: 2 g via INTRAVENOUS
  Filled 2018-10-09: qty 50

## 2018-10-09 NOTE — H&P (Signed)
Brittany Lee Documented: 10/04/2018 10:45 AM Location: [facility] Patient #: [id number] DOB: 05/10/1966 Single / Language: Brittany Lee / Race: White Female   History of Present Illness Brittany Klein MD; 10/04/2018 1:24 PM) The patient is a 53 year old female who presents with an abdominal mass. Patient is a lovely 53 year old female referred by Dr. Bobby Lee in [place] for consultation regarding a retroperitoneal mass. The patient presented to her primary care doctor with abdominal distention and early satiety. This has been going on retrospectively for around 3 months, but did not get significant until the last month. She first underwent CT scan which showed a large mass in the left abdomen and pelvis. On CT, this was felt to possibly arise from the ovary. It has solid elements as well as fat. It mainly appears to displace the bowel anteriorly and the kidney posteriorly. It was felt to be potentially malignant.  This was then followed up with MRI showing the mass to be 23 x 14 x 21 cm. It was very similar in terms of the displacement of the adjacent organs. 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 The patient did actually have a CT scan in May 2017. There was nothing similar in that location 3 years ago. She did have a staging scan of the chest which was negative for metastatic disease. The patient denies any significant pain. She has not really lost weight despite decrease in appetite because of the bloating in her abdomen.  The scans and images are reviewed in the canopy system as well as the reports. These are discussed with the patient as well as Dr. Bobby Lee.   Past Surgical History Brittany Lee, CMA; 10/04/2018 10:45 AM) Appendectomy  Cesarean Section - Multiple  Knee Surgery  Right. Tonsillectomy   Diagnostic Studies History Brittany Lee, CMA; 10/04/2018 10:45 AM) Colonoscopy  within last year Mammogram  within last year Pap Smear  1-5  years ago  Allergies Brittany Lee, CMA; 10/04/2018 10:46 AM) Sulfa Drugs  Allergies Reconciled   Medication History Brittany Lee, CMA; 10/04/2018 10:49 AM) Pantoprazole Sodium (40MG  Tablet DR, Oral) Active. ALPRAZolam (1MG  Tablet, Oral) Active. Acyclovir (400MG  Tablet, Oral) Active. Ambien (10MG  Tablet, Oral) Active. Lasix (20MG  Tablet, Oral) Active. Protonix (40MG  Packet, Oral) Active. Tricor (145MG  Tablet, Oral) Active. Xanax (1MG  Tablet, Oral) Active. hydroCHLOROthiazide (12.5MG  Tablet, Oral) Active. metFORMIN HCl (500MG  Tablet, Oral) Active. Medications Reconciled  Social History Brittany Lee, CMA; 10/04/2018 10:45 AM) Alcohol use  Occasional alcohol use. Caffeine use  Carbonated beverages. No drug use  Tobacco use  Never smoker.  Family History Brittany Lee, Brittany Lee; 10/04/2018 10:45 AM) Breast Cancer  Mother. Cerebrovascular Accident  Father. Diabetes Mellitus  Father. Hypertension  Father. Thyroid problems  Mother.  Pregnancy / Birth History Brittany Lee, [place]; 10/04/2018 10:45 AM) Age at menarche  26 years. Age of menopause  64-50 Contraceptive History  Intrauterine device, Oral contraceptives. Gravida  2 Irregular periods  Maternal age  66-30 Para  2  Other Problems Brittany Lee, CMA; 10/04/2018 10:45 AM) Arthritis  Gastroesophageal Reflux Disease  Kidney Stone     Review of Systems Brittany Lee CMA; 10/04/2018 10:45 AM) General Not Present- Appetite Loss, Chills, Fatigue, Fever, Night Sweats, Weight Gain and Weight Loss. Skin Not Present- Change in Wart/Mole, Dryness, Hives, Jaundice, New Lesions, Non-Healing Wounds, Rash and Ulcer. HEENT Not Present- Earache, Hearing Loss, Hoarseness, Nose Bleed, Oral Ulcers, Ringing in the Ears, Seasonal Allergies, Sinus Pain, Sore Throat, Visual Disturbances, Wears glasses/contact lenses and Yellow  Eyes. Respiratory Not Present- Bloody sputum, Chronic Cough, Difficulty Breathing,  Snoring and Wheezing. Breast Not Present- Breast Mass, Breast Pain, Nipple Discharge and Skin Changes. Cardiovascular Present- Shortness of Breath. Not Present- Chest Pain, Difficulty Breathing Lying Down, Leg Cramps, Palpitations, Rapid Heart Rate and Swelling of Extremities. Gastrointestinal Present- Bloating, Gets full quickly at meals and Indigestion. Not Present- Abdominal Pain, Bloody Stool, Change in Bowel Habits, Chronic diarrhea, Constipation, Difficulty Swallowing, Excessive gas, Hemorrhoids, Nausea, Rectal Pain and Vomiting. Female Genitourinary Not Present- Frequency, Nocturia, Painful Urination, Pelvic Pain and Urgency. Musculoskeletal Present- Back Pain. Not Present- Joint Pain, Joint Stiffness, Muscle Pain, Muscle Weakness and Swelling of Extremities. Neurological Not Present- Decreased Memory, Fainting, Headaches, Numbness, Seizures, Tingling, Tremor, Trouble walking and Weakness. Psychiatric Not Present- Anxiety, Bipolar, Change in Sleep Pattern, Depression, Fearful and Frequent crying. Endocrine Not Present- Cold Intolerance, Excessive Hunger, Hair Changes, Heat Intolerance, Hot flashes and New Diabetes. Hematology Not Present- Blood Thinners, Easy Bruising, Excessive bleeding, Gland problems, HIV and Persistent Infections.  Vitals (Brittany Lee CMA; 10/04/2018 10:50 AM) 10/04/2018 10:49 AM Weight: 202.4 lb Height: 64in Body Surface Area: 1.97 m Body Mass Index: 34.74 kg/m  Temp.: 98.32F(Temporal)  Pulse: 109 (Regular)  P.OX: 98% (Room air) BP: 190/110 (Sitting, Left Arm, Standard)       Physical Exam Brittany Klein MD; 10/04/2018 1:25 PM) General Mental Status-Alert. General Appearance-Consistent with stated age. Hydration-Well hydrated. Voice-Normal.  Head and Neck Head-normocephalic, atraumatic with no lesions or palpable masses. Trachea-midline. Thyroid Gland Characteristics - normal size and consistency.  Eye Eyeball -  Bilateral-Extraocular movements intact. Sclera/Conjunctiva - Bilateral-No scleral icterus.  Chest and Lung Exam Chest and lung exam reveals -quiet, even and easy respiratory effort with no use of accessory muscles and on auscultation, normal breath sounds, no adventitious sounds and normal vocal resonance. Inspection Chest Wall - Normal. Back - normal.  Cardiovascular Cardiovascular examination reveals -normal heart sounds, regular rate and rhythm with no murmurs and normal pedal pulses bilaterally.  Abdomen Inspection Inspection of the abdomen reveals - No Hernias. Palpation/Percussion Palpation and Percussion of the abdomen reveal - Non Tender, No Rebound tenderness, No Rigidity (guarding) and No hepatosplenomegaly. Note: partially firm left abdomen. Significant abdominal distention. Auscultation Auscultation of the abdomen reveals - Bowel sounds normal.  Neurologic Neurologic evaluation reveals -alert and oriented x 3 with no impairment of recent or remote memory. Mental Status-Normal.  Musculoskeletal Global Assessment -Note: no gross deformities.  Normal Exam - Left-Upper Extremity Strength Normal and Lower Extremity Strength Normal. Normal Exam - Right-Upper Extremity Strength Normal and Lower Extremity Strength Normal.  Lymphatic Head & Neck  General Head & Neck Lymphatics: Bilateral - Description - Normal. Axillary  General Axillary Region: Bilateral - Description - Normal. Tenderness - Non Tender. Femoral & Inguinal  Generalized Femoral & Inguinal Lymphatics: Bilateral - Description - No Generalized lymphadenopathy.    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 I do recommend excision. I would not delay this more than 4 weeks based on the possibility of malignancy. The patient had a relatively rapid increase in abdominal size and  symptoms.  Based on the appearance on CT and MRI, I think that we will be able to take the mass without removing adjacent organs. However, the patient and her friend are informed that sometimes because of the blood supply we do end up having to take a piece of bowel or a part of the stomach or ovary.  I discussed that I would recommend a bowel prep because of that.  I discussed that principally the risks are bleeding, infection, damage to adjacent structures, risk of hernia at the incision, risk of additional procedures or surgeries being required. I discussed the risk of recurrent cancer. I discussed the risk of blood clot, heart or lung complications, or prolonged hospitalization.  I discussed that the patient will likely need at least 4 to 6 weeks off of work. However, I discussed that if her pain is controlled and she is able to concentrate and not on narcotics, that I would not object to her working from home earlier than that. I discussed that she will most likely need a minimum of 3 days in the hospital and that the endpoints that will need to be reached prior to her leaving her bowel function, oral pain control, and intake of adequate liquids and calories.  I did review that the incision would be in the midline and would be quite large in order to remove the large tumor.  60 min spent in evaluation, examination, counseling, and coordination of care. >50% spent in counseling. Current Plans Pt Education - [facility] Colon Bowel Prep 2018 ERAS/Miralax/Antibiotics Started Neomycin Sulfate 500 MG Oral Tablet, 2 (two) Tablet SEE NOTE, #6, 10/04/2018, No Refill. Local Order: TAKE TWO TABLETS AT 2 PM, 3 PM, AND 10 PM THE DAY PRIOR TO SURGERY Started Flagyl 500 MG Oral Tablet, 2 (two) Tablet SEE NOTE, #6, 10/04/2018, No Refill. Local Order: Take at 2pm, 3pm, and 10pm the day prior to your colon operation You are being scheduled for surgery- Our schedulers will call you.  You should hear from our office's  scheduling department within 5 working days about the location, date, and time of surgery. We try to make accommodations for patient's preferences in scheduling surgery, but sometimes the OR schedule or the surgeon's schedule prevents [place] from making those accommodations.  If you have not heard from our office [phone]) in 5 working days, call the office and ask for your surgeon's nurse.  If you have other questions about your diagnosis, plan, or surgery, call the office and ask for your surgeon's nurse.    Signed by Brittany Klein, MD (10/04/2018 1:31 PM)

## 2018-10-09 NOTE — Progress Notes (Signed)
Anesthesia Chart Review:  Case:  [id number] Date/Time:  10/10/18 0715   Procedure:  RESECTION OF RETROPERITONEAL MASS (N/A )   Anesthesia type:  General   Pre-op diagnosis:  RETROPERITONEAL MASS   Location:  [facility] OR ROOM 08 / [place] [facility]   Surgeon:  [name], MD      DISCUSSION: Patient is a 53 year old female scheduled for the above procedure. According to H&P, patient had a recent CT for evaluation of abdominal distention. CT showed a large mass in the left abdomen and pelvis. MRI was ordered "showing the mass to be 23 x 14 x 21 cm. It was very similar in terms of the displacement of the adjacent organs. 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  History includes never smoker, acid reflux, insulin resistance. BMI is consistent with obesity.   If no acute changes then I anticipate that she can proceed as planned. She will need a urine pregnancy test on the day of surgery.    VS: BP 122/60   Pulse 88   Temp 36.9 C (Oral)   Resp 18   Ht 5\' 4"  (1.626 m)   Wt 91.7 kg   SpO2 97%   BMI 34.69 kg/m   PROVIDERS: Ernestene Kiel, MD is PCP   LABS: Labs reviewed: Acceptable for surgery. Patient was able to pull up her 10/02/18 A1c results on her smart phone--result was 5.9. (all labs ordered are listed, but only abnormal results are displayed)  Labs Reviewed  GLUCOSE, CAPILLARY - Abnormal; Notable for the following components:      Result Value   Glucose-Capillary 103 (*)    All other components within normal limits  CBC WITH DIFFERENTIAL/PLATELET - Abnormal; Notable for the following components:   Hemoglobin 11.4 (*)    All other components within normal limits  COMPREHENSIVE METABOLIC PANEL - Abnormal; Notable for the following components:   Glucose, Bld 108 (*)    All other components within normal limits  PROTIME-INR  TYPE AND SCREEN  ABO/RH    Spirometry 07/05/18: FVC 2.81 (83%), FEV1 2.20 (82%), FEV1R 0.78 (98%).  FEF25-75 2.19 (82%).  Interpretation: Normal ventilatory function.    EKG: 10/08/18: Normal sinus rhythm Low voltage QRS No significant change since last tracing Confirmed by Kirk Ruths [phone]) on 10/08/2018 10:46:36 AM   CV: N/A   Past Medical History:  Diagnosis Date  . Acid reflux   . History of kidney stones   . Insulin resistance   . Osteoarthritis of knee   . Pneumonia     Past Surgical History:  Procedure Laterality Date  . APPENDECTOMY  2012  . CARPAL TUNNEL RELEASE Bilateral   . [place]  . TONSILLECTOMY      MEDICATIONS: . ALPRAZolam (XANAX) 1 MG tablet  . chlorpheniramine-HYDROcodone (TUSSIONEX PENNKINETIC ER) 10-8 MG/5ML SUER  . fenofibrate (TRICOR) 145 MG tablet  . hydrochlorothiazide (HYDRODIURIL) 25 MG tablet  . MEGARED OMEGA-3 KRILL OIL PO  . metFORMIN (GLUCOPHAGE-XR) 500 MG 24 hr tablet  . Multiple Vitamin (MULTIVITAMIN WITH MINERALS) TABS tablet  . pantoprazole (PROTONIX) 40 MG tablet  . ranitidine (ZANTAC) 300 MG tablet  . sertraline (ZOLOFT) 50 MG tablet  . zolpidem (AMBIEN CR) 12.5 MG CR tablet   No current facility-administered medications for this encounter.    Derrill Memo ON 10/10/2018] cefoTEtan in Dextrose 5% (CEFOTAN) IVPB 2 g    Myra Gianotti, PA-C Surgical Short Stay/Anesthesiology [facility] Phone [phone] [facility]  Phone [phone] 10/09/2018 10:40 AM

## 2018-10-09 NOTE — Anesthesia Preprocedure Evaluation (Addendum)
Anesthesia Evaluation  Patient identified by MRN, date of birth, ID band Patient awake    Reviewed: Allergy & Precautions, NPO status , Patient's Chart, lab work & pertinent test results  Airway Mallampati: III  TM Distance: >3 FB Neck ROM: Full    Dental no notable dental hx. (+) Teeth Intact, Dental Advisory Given   Pulmonary neg pulmonary ROS,    Pulmonary exam normal breath sounds clear to auscultation       Cardiovascular negative cardio ROS Normal cardiovascular exam Rhythm:Regular Rate:Normal  ECG: NSR, rate 82   Neuro/Psych PSYCHIATRIC DISORDERS negative neurological ROS     GI/Hepatic Neg liver ROS, GERD  Medicated and Controlled,  Endo/Other  Insulin resistance  Renal/GU negative Renal ROS     Musculoskeletal negative musculoskeletal ROS (+)   Abdominal (+) + obese,   Peds  Hematology negative hematology ROS (+)   Anesthesia Other Findings RETROPERITONEAL MASS  Reproductive/Obstetrics                           Anesthesia Physical Anesthesia Plan  ASA: II  Anesthesia Plan: General   Post-op Pain Management:    Induction: Intravenous  PONV Risk Score and Plan: 4 or greater and Ondansetron, Dexamethasone, Treatment may vary due to age or medical condition and Midazolam  Airway Management Planned: Oral ETT  Additional Equipment:   Intra-op Plan:   Post-operative Plan: Extubation in OR  Informed Consent: I have reviewed the patients History and Physical, chart, labs and discussed the procedure including the risks, benefits and alternatives for the proposed anesthesia with the patient or authorized representative who has indicated his/her understanding and acceptance.     Dental advisory given  Plan Discussed with: CRNA  Anesthesia Plan Comments: (PAT note written 10/09/2018 by Myra Gianotti, PA-C. )      Anesthesia Quick Evaluation

## 2018-10-10 ENCOUNTER — Inpatient Hospital Stay (HOSPITAL_COMMUNITY): Payer: Commercial Managed Care - PPO | Admitting: Vascular Surgery

## 2018-10-10 ENCOUNTER — Inpatient Hospital Stay (HOSPITAL_COMMUNITY)
Admission: RE | Admit: 2018-10-10 | Discharge: 2018-10-13 | DRG: 828 | Disposition: A | Discharge status: Home / Self Care | Payer: Commercial Managed Care - PPO | Attending: Orthopaedic Surgery | Admitting: Orthopaedic Surgery

## 2018-10-10 ENCOUNTER — Encounter (HOSPITAL_COMMUNITY)
Admission: RE | Disposition: A | Discharge status: Home / Self Care | Payer: Self-pay | Source: Home / Self Care | Attending: General Surgery

## 2018-10-10 ENCOUNTER — Encounter (HOSPITAL_COMMUNITY): Payer: Self-pay | Admitting: *Deleted

## 2018-10-10 ENCOUNTER — Inpatient Hospital Stay (HOSPITAL_COMMUNITY): Payer: Commercial Managed Care - PPO | Admitting: Anesthesiology

## 2018-10-10 ENCOUNTER — Other Ambulatory Visit: Payer: Self-pay | Admitting: General Surgery

## 2018-10-10 ENCOUNTER — Other Ambulatory Visit: Payer: Self-pay

## 2018-10-10 DIAGNOSIS — Z882 Allergy status to sulfonamides status: Secondary | ICD-10-CM

## 2018-10-10 DIAGNOSIS — E669 Obesity, unspecified: Secondary | ICD-10-CM | POA: Diagnosis present

## 2018-10-10 DIAGNOSIS — C480 Malignant neoplasm of retroperitoneum: Secondary | ICD-10-CM | POA: Diagnosis present

## 2018-10-10 DIAGNOSIS — Z87442 Personal history of urinary calculi: Secondary | ICD-10-CM | POA: Diagnosis not present

## 2018-10-10 DIAGNOSIS — Z6834 Body mass index (BMI) 34.0-34.9, adult: Secondary | ICD-10-CM | POA: Diagnosis not present

## 2018-10-10 DIAGNOSIS — K219 Gastro-esophageal reflux disease without esophagitis: Secondary | ICD-10-CM | POA: Diagnosis present

## 2018-10-10 DIAGNOSIS — Z888 Allergy status to other drugs, medicaments and biological substances status: Secondary | ICD-10-CM | POA: Diagnosis not present

## 2018-10-10 DIAGNOSIS — Z8249 Family history of ischemic heart disease and other diseases of the circulatory system: Secondary | ICD-10-CM | POA: Diagnosis not present

## 2018-10-10 DIAGNOSIS — Z833 Family history of diabetes mellitus: Secondary | ICD-10-CM | POA: Diagnosis not present

## 2018-10-10 DIAGNOSIS — M1990 Unspecified osteoarthritis, unspecified site: Secondary | ICD-10-CM | POA: Diagnosis present

## 2018-10-10 DIAGNOSIS — Z803 Family history of malignant neoplasm of breast: Secondary | ICD-10-CM | POA: Diagnosis not present

## 2018-10-10 DIAGNOSIS — R1900 Intra-abdominal and pelvic swelling, mass and lump, unspecified site: Secondary | ICD-10-CM | POA: Diagnosis present

## 2018-10-10 DIAGNOSIS — R1909 Other intra-abdominal and pelvic swelling, mass and lump: Secondary | ICD-10-CM | POA: Diagnosis present

## 2018-10-10 HISTORY — PX: RESECTION OF RETROPERITONEAL MASS: SHX6340

## 2018-10-10 HISTORY — PX: RETROPERITONEAL MASS EXCISION: SHX2342

## 2018-10-10 LAB — URINALYSIS, ROUTINE W REFLEX MICROSCOPIC
Bilirubin Urine: NEGATIVE
Glucose, UA: NEGATIVE mg/dL
Hgb urine dipstick: NEGATIVE
Ketones, ur: NEGATIVE mg/dL
Nitrite: NEGATIVE
PROTEIN: NEGATIVE mg/dL
Specific Gravity, Urine: 1.018 (ref 1.005–1.030)
pH: 5 (ref 5.0–8.0)

## 2018-10-10 LAB — GLUCOSE, CAPILLARY
GLUCOSE-CAPILLARY: 147 mg/dL — AB (ref 70–99)
Glucose-Capillary: 144 mg/dL — ABNORMAL HIGH (ref 70–99)
Glucose-Capillary: 126 mg/dL — ABNORMAL HIGH (ref 70–99)

## 2018-10-10 LAB — POCT PREGNANCY, URINE: Preg Test, Ur: NEGATIVE

## 2018-10-10 SURGERY — EXCISION, MASS, RETROPERITONEUM
Anesthesia: General | Site: Abdomen | Laterality: Bilateral

## 2018-10-10 MED ORDER — ALPRAZOLAM 0.5 MG PO TABS: 0.5000 mg | ORAL_TABLET | Freq: Three times a day (TID) | ORAL | Status: DC | PRN

## 2018-10-10 MED ORDER — ONDANSETRON HCL 4 MG/2ML IJ SOLN
INTRAMUSCULAR | Status: DC | PRN
  Administered 2018-10-10: 4 mg via INTRAVENOUS

## 2018-10-10 MED ORDER — ONDANSETRON HCL 4 MG/2ML IJ SOLN: 4.0000 mg | Freq: Four times a day (QID) | INTRAMUSCULAR | Status: DC | PRN

## 2018-10-10 MED ORDER — ENOXAPARIN SODIUM 40 MG/0.4ML ~~LOC~~ SOLN
40.0000 mg | SUBCUTANEOUS | Status: DC
  Administered 2018-10-11 – 2018-10-13 (×3): 40 mg via SUBCUTANEOUS
  Filled 2018-10-10 (×3): qty 0.4

## 2018-10-10 MED ORDER — CHLORHEXIDINE GLUCONATE CLOTH 2 % EX PADS: 6.0000 | MEDICATED_PAD | Freq: Once | CUTANEOUS | Status: DC

## 2018-10-10 MED ORDER — GABAPENTIN 300 MG PO CAPS
300.0000 mg | ORAL_CAPSULE | ORAL | Status: AC
  Administered 2018-10-10: 300 mg via ORAL
  Filled 2018-10-10: qty 1

## 2018-10-10 MED ORDER — PANTOPRAZOLE SODIUM 40 MG PO TBEC
40.0000 mg | DELAYED_RELEASE_TABLET | Freq: Every day | ORAL | Status: DC
  Administered 2018-10-11 – 2018-10-13 (×3): 40 mg via ORAL
  Filled 2018-10-10 (×3): qty 1

## 2018-10-10 MED ORDER — SODIUM CHLORIDE 0.9% FLUSH: 9.0000 mL | INTRAVENOUS | Status: DC | PRN

## 2018-10-10 MED ORDER — KETOROLAC TROMETHAMINE 30 MG/ML IJ SOLN
INTRAMUSCULAR | Status: AC
  Filled 2018-10-10: qty 1

## 2018-10-10 MED ORDER — HYDROMORPHONE 1 MG/ML IV SOLN
INTRAVENOUS | Status: DC
  Administered 2018-10-10: 2.7 mg via INTRAVENOUS
  Administered 2018-10-10 – 2018-10-11 (×3): 0.6 mg via INTRAVENOUS
  Administered 2018-10-11: 0.3 mg via INTRAVENOUS
  Administered 2018-10-11: 1.5 mg via INTRAVENOUS
  Administered 2018-10-12: 0.9 mg via INTRAVENOUS
  Administered 2018-10-12: 0 mg via INTRAVENOUS
  Filled 2018-10-10: qty 25

## 2018-10-10 MED ORDER — BUPIVACAINE-EPINEPHRINE (PF) 0.25% -1:200000 IJ SOLN
INTRAMUSCULAR | Status: AC
  Filled 2018-10-10: qty 30

## 2018-10-10 MED ORDER — LIDOCAINE 2% (20 MG/ML) 5 ML SYRINGE
INTRAMUSCULAR | Status: DC | PRN
  Administered 2018-10-10: 40 mg via INTRAVENOUS

## 2018-10-10 MED ORDER — SERTRALINE HCL 50 MG PO TABS
50.0000 mg | ORAL_TABLET | Freq: Every day | ORAL | Status: DC
  Administered 2018-10-11 – 2018-10-13 (×3): 50 mg via ORAL
  Filled 2018-10-10 (×3): qty 1

## 2018-10-10 MED ORDER — FENTANYL CITRATE (PF) 100 MCG/2ML IJ SOLN
INTRAMUSCULAR | Status: DC | PRN
  Administered 2018-10-10 (×2): 50 ug via INTRAVENOUS
  Administered 2018-10-10: 100 ug via INTRAVENOUS

## 2018-10-10 MED ORDER — DIPHENHYDRAMINE HCL 12.5 MG/5ML PO ELIX: 12.5000 mg | ORAL_SOLUTION | Freq: Four times a day (QID) | ORAL | Status: DC | PRN

## 2018-10-10 MED ORDER — HYDROMORPHONE 1 MG/ML IV SOLN
INTRAVENOUS | Status: DC
  Administered 2018-10-10: 1 mg via INTRAVENOUS
  Filled 2018-10-10: qty 25

## 2018-10-10 MED ORDER — NALOXONE HCL 0.4 MG/ML IJ SOLN: 0.4000 mg | INTRAMUSCULAR | Status: DC | PRN

## 2018-10-10 MED ORDER — SIMETHICONE 80 MG PO CHEW: 40.0000 mg | CHEWABLE_TABLET | Freq: Four times a day (QID) | ORAL | Status: DC | PRN

## 2018-10-10 MED ORDER — KETOROLAC TROMETHAMINE 30 MG/ML IJ SOLN
30.0000 mg | Freq: Once | INTRAMUSCULAR | Status: AC | PRN
  Administered 2018-10-10: 30 mg via INTRAVENOUS

## 2018-10-10 MED ORDER — HEMOSTATIC AGENTS (NO CHARGE) OPTIME
TOPICAL | Status: DC | PRN
  Administered 2018-10-10 (×2): 1 via TOPICAL

## 2018-10-10 MED ORDER — KCL IN DEXTROSE-NACL 20-5-0.45 MEQ/L-%-% IV SOLN
INTRAVENOUS | Status: DC
  Administered 2018-10-10 – 2018-10-12 (×6): via INTRAVENOUS
  Filled 2018-10-10 (×6): qty 1000

## 2018-10-10 MED ORDER — DEXAMETHASONE SODIUM PHOSPHATE 10 MG/ML IJ SOLN
INTRAMUSCULAR | Status: AC
  Filled 2018-10-10: qty 1

## 2018-10-10 MED ORDER — MIDAZOLAM HCL 5 MG/5ML IJ SOLN
INTRAMUSCULAR | Status: DC | PRN
  Administered 2018-10-10: 2 mg via INTRAVENOUS

## 2018-10-10 MED ORDER — LACTATED RINGERS IV SOLN
INTRAVENOUS | Status: DC | PRN
  Administered 2018-10-10 (×2): via INTRAVENOUS

## 2018-10-10 MED ORDER — LIDOCAINE 2% (20 MG/ML) 5 ML SYRINGE
INTRAMUSCULAR | Status: AC
  Filled 2018-10-10: qty 5

## 2018-10-10 MED ORDER — HYDROMORPHONE HCL 1 MG/ML IJ SOLN
0.2500 mg | INTRAMUSCULAR | Status: DC | PRN
  Administered 2018-10-10 (×2): 0.5 mg via INTRAVENOUS

## 2018-10-10 MED ORDER — OXYCODONE HCL 5 MG/5ML PO SOLN: 5.0000 mg | Freq: Once | ORAL | Status: DC | PRN

## 2018-10-10 MED ORDER — FENTANYL CITRATE (PF) 100 MCG/2ML IJ SOLN: INTRAMUSCULAR | Status: DC | PRN

## 2018-10-10 MED ORDER — SCOPOLAMINE 1 MG/3DAYS TD PT72
MEDICATED_PATCH | TRANSDERMAL | Status: AC
  Filled 2018-10-10: qty 1

## 2018-10-10 MED ORDER — MIDAZOLAM HCL 2 MG/2ML IJ SOLN
INTRAMUSCULAR | Status: AC
  Filled 2018-10-10: qty 2

## 2018-10-10 MED ORDER — BUPIVACAINE-EPINEPHRINE (PF) 0.25% -1:200000 IJ SOLN
INTRAMUSCULAR | Status: DC | PRN
  Administered 2018-10-10: 10 mL via PERINEURAL

## 2018-10-10 MED ORDER — DIPHENHYDRAMINE HCL 50 MG/ML IJ SOLN: 12.5000 mg | Freq: Four times a day (QID) | INTRAMUSCULAR | Status: DC | PRN

## 2018-10-10 MED ORDER — METOPROLOL TARTRATE 5 MG/5ML IV SOLN: 5.0000 mg | Freq: Four times a day (QID) | INTRAVENOUS | Status: DC | PRN

## 2018-10-10 MED ORDER — STERILE WATER FOR IRRIGATION IR SOLN
Status: DC | PRN
  Administered 2018-10-10: 1000 mL

## 2018-10-10 MED ORDER — LIDOCAINE HCL 1 % IJ SOLN
INTRAMUSCULAR | Status: AC
  Filled 2018-10-10: qty 20

## 2018-10-10 MED ORDER — TRAMADOL HCL 50 MG PO TABS: 50.0000 mg | ORAL_TABLET | Freq: Four times a day (QID) | ORAL | Status: DC | PRN

## 2018-10-10 MED ORDER — LACTATED RINGERS IV SOLN: INTRAVENOUS | Status: DC | PRN

## 2018-10-10 MED ORDER — CEFAZOLIN SODIUM-DEXTROSE 2-4 GM/100ML-% IV SOLN
2.0000 g | Freq: Three times a day (TID) | INTRAVENOUS | Status: AC
  Administered 2018-10-10: 2 g via INTRAVENOUS
  Filled 2018-10-10: qty 100

## 2018-10-10 MED ORDER — ONDANSETRON 4 MG PO TBDP: 4.0000 mg | ORAL_TABLET | Freq: Four times a day (QID) | ORAL | Status: DC | PRN

## 2018-10-10 MED ORDER — OXYCODONE HCL 5 MG PO TABS: 5.0000 mg | ORAL_TABLET | Freq: Once | ORAL | Status: DC | PRN

## 2018-10-10 MED ORDER — FAMOTIDINE 20 MG PO TABS
20.0000 mg | ORAL_TABLET | Freq: Every day | ORAL | Status: DC
  Administered 2018-10-10 – 2018-10-12 (×2): 20 mg via ORAL
  Filled 2018-10-10 (×3): qty 1

## 2018-10-10 MED ORDER — DOCUSATE SODIUM 100 MG PO CAPS
100.0000 mg | ORAL_CAPSULE | Freq: Two times a day (BID) | ORAL | Status: DC
  Administered 2018-10-10 – 2018-10-13 (×7): 100 mg via ORAL
  Filled 2018-10-10 (×7): qty 1

## 2018-10-10 MED ORDER — DEXAMETHASONE SODIUM PHOSPHATE 10 MG/ML IJ SOLN
INTRAMUSCULAR | Status: DC | PRN
  Administered 2018-10-10: 10 mg via INTRAVENOUS

## 2018-10-10 MED ORDER — ACETAMINOPHEN 325 MG PO TABS
650.0000 mg | ORAL_TABLET | Freq: Four times a day (QID) | ORAL | Status: DC | PRN
  Administered 2018-10-11 – 2018-10-13 (×3): 650 mg via ORAL
  Filled 2018-10-10 (×4): qty 2

## 2018-10-10 MED ORDER — LACTATED RINGERS IV SOLN
INTRAVENOUS | Status: DC | PRN
  Administered 2018-10-10: 08:00:00 via INTRAVENOUS

## 2018-10-10 MED ORDER — ZOLPIDEM TARTRATE 5 MG PO TABS
5.0000 mg | ORAL_TABLET | Freq: Every evening | ORAL | Status: DC | PRN
  Administered 2018-10-10: 5 mg via ORAL
  Filled 2018-10-10: qty 1

## 2018-10-10 MED ORDER — PROMETHAZINE HCL 25 MG/ML IJ SOLN: 6.2500 mg | INTRAMUSCULAR | Status: DC | PRN

## 2018-10-10 MED ORDER — FENOFIBRATE 160 MG PO TABS
160.0000 mg | ORAL_TABLET | Freq: Every day | ORAL | Status: DC
  Administered 2018-10-11 – 2018-10-13 (×3): 160 mg via ORAL
  Filled 2018-10-10 (×3): qty 1

## 2018-10-10 MED ORDER — METHOCARBAMOL 500 MG PO TABS
500.0000 mg | ORAL_TABLET | Freq: Four times a day (QID) | ORAL | Status: DC | PRN
  Administered 2018-10-11 – 2018-10-13 (×3): 500 mg via ORAL
  Filled 2018-10-10 (×3): qty 1

## 2018-10-10 MED ORDER — FENTANYL CITRATE (PF) 250 MCG/5ML IJ SOLN
INTRAMUSCULAR | Status: AC
  Filled 2018-10-10: qty 5

## 2018-10-10 MED ORDER — 0.9 % SODIUM CHLORIDE (POUR BTL) OPTIME
TOPICAL | Status: DC | PRN
  Administered 2018-10-10: 1000 mL

## 2018-10-10 MED ORDER — BUPIVACAINE 0.25 % ON-Q PUMP DUAL CATH 300 ML
300.0000 mL | INJECTION | Status: AC
  Administered 2018-10-10: 300 mL
  Filled 2018-10-10: qty 300

## 2018-10-10 MED ORDER — SCOPOLAMINE 1 MG/3DAYS TD PT72
MEDICATED_PATCH | TRANSDERMAL | Status: DC | PRN
  Administered 2018-10-10: 1 via TRANSDERMAL

## 2018-10-10 MED ORDER — PROPOFOL 10 MG/ML IV BOLUS
INTRAVENOUS | Status: DC | PRN
  Administered 2018-10-10: 160 mg via INTRAVENOUS

## 2018-10-10 MED ORDER — OXYCODONE HCL 5 MG PO TABS: 5.0000 mg | ORAL_TABLET | ORAL | Status: DC | PRN

## 2018-10-10 MED ORDER — PHENYLEPHRINE 40 MCG/ML (10ML) SYRINGE FOR IV PUSH (FOR BLOOD PRESSURE SUPPORT)
PREFILLED_SYRINGE | INTRAVENOUS | Status: DC | PRN
  Administered 2018-10-10: 120 ug via INTRAVENOUS
  Administered 2018-10-10 (×2): 80 ug via INTRAVENOUS
  Administered 2018-10-10: 120 ug via INTRAVENOUS

## 2018-10-10 MED ORDER — GABAPENTIN 300 MG PO CAPS
300.0000 mg | ORAL_CAPSULE | Freq: Two times a day (BID) | ORAL | Status: DC
  Administered 2018-10-10 – 2018-10-13 (×6): 300 mg via ORAL
  Filled 2018-10-10 (×6): qty 1

## 2018-10-10 MED ORDER — ACETAMINOPHEN 500 MG PO TABS
1000.0000 mg | ORAL_TABLET | ORAL | Status: AC
  Administered 2018-10-10: 1000 mg via ORAL
  Filled 2018-10-10: qty 2

## 2018-10-10 MED ORDER — ROCURONIUM BROMIDE 10 MG/ML (PF) SYRINGE
PREFILLED_SYRINGE | INTRAVENOUS | Status: DC | PRN
  Administered 2018-10-10: 50 mg via INTRAVENOUS
  Administered 2018-10-10: 30 mg via INTRAVENOUS

## 2018-10-10 MED ORDER — ONDANSETRON HCL 4 MG/2ML IJ SOLN
INTRAMUSCULAR | Status: AC
  Filled 2018-10-10: qty 2

## 2018-10-10 MED ORDER — INSULIN ASPART 100 UNIT/ML ~~LOC~~ SOLN
0.0000 [IU] | Freq: Three times a day (TID) | SUBCUTANEOUS | Status: DC
  Administered 2018-10-10: 2 [IU] via SUBCUTANEOUS

## 2018-10-10 MED ORDER — PROPOFOL 10 MG/ML IV BOLUS
INTRAVENOUS | Status: AC
  Filled 2018-10-10: qty 20

## 2018-10-10 MED ORDER — HYDROMORPHONE HCL 1 MG/ML IJ SOLN
INTRAMUSCULAR | Status: AC
  Filled 2018-10-10: qty 1

## 2018-10-10 MED ORDER — ACETAMINOPHEN 650 MG RE SUPP: 650.0000 mg | Freq: Four times a day (QID) | RECTAL | Status: DC | PRN

## 2018-10-10 MED ORDER — SUGAMMADEX SODIUM 200 MG/2ML IV SOLN
INTRAVENOUS | Status: DC | PRN
  Administered 2018-10-10: 200 mg via INTRAVENOUS

## 2018-10-10 MED ORDER — LIDOCAINE HCL 2 % IJ SOLN
INTRAMUSCULAR | Status: AC
  Filled 2018-10-10: qty 20

## 2018-10-10 MED ORDER — BUPIVACAINE ON-Q PAIN PUMP (FOR ORDER SET NO CHG)
INJECTION | Status: DC
  Filled 2018-10-10: qty 1

## 2018-10-10 MED ORDER — MAGNESIUM CITRATE PO SOLN: 1.0000 | Freq: Once | ORAL | Status: DC | PRN

## 2018-10-10 MED ORDER — BISACODYL 5 MG PO TBEC: 5.0000 mg | DELAYED_RELEASE_TABLET | Freq: Every day | ORAL | Status: DC | PRN

## 2018-10-10 SURGICAL SUPPLY — 61 items
BLADE CLIPPER SURG (BLADE) IMPLANT
CANISTER SUCT 3000ML PPV (MISCELLANEOUS) ×2 IMPLANT
CATH KIT ON-Q SILVERSOAK 7.5 (CATHETERS) IMPLANT
CATH KIT ON-Q SILVERSOAK 7.5IN (CATHETERS) ×2 IMPLANT
CLIP VESOCCLUDE LG 6/CT (CLIP) ×2 IMPLANT
CLIP VESOCCLUDE MED 6/CT (CLIP) ×1 IMPLANT
CLIP VESOLOCK LG 6/CT PURPLE (CLIP) ×2 IMPLANT
CLIP VESOLOCK MED 6/CT (CLIP) ×2 IMPLANT
CLIP VESOLOCK MED LG 6/CT (CLIP) ×1 IMPLANT
CLSR STERI-STRIP ANTIMIC 1/2X4 (GAUZE/BANDAGES/DRESSINGS) ×1 IMPLANT
COVER SURGICAL LIGHT HANDLE (MISCELLANEOUS) ×4 IMPLANT
COVER WAND RF STERILE (DRAPES) ×2 IMPLANT
DRSG COVADERM 4X14 (GAUZE/BANDAGES/DRESSINGS) ×1 IMPLANT
DRSG OPSITE POSTOP 4X10 (GAUZE/BANDAGES/DRESSINGS) IMPLANT
DRSG OPSITE POSTOP 4X8 (GAUZE/BANDAGES/DRESSINGS) IMPLANT
DRSG TEGADERM 4X4.75 (GAUZE/BANDAGES/DRESSINGS) ×2 IMPLANT
ELECT BLADE 6.5 EXT (BLADE) ×1 IMPLANT
ELECT CAUTERY BLADE 6.4 (BLADE) ×3 IMPLANT
ELECT REM PT RETURN 9FT ADLT (ELECTROSURGICAL) ×2
ELECTRODE REM PT RTRN 9FT ADLT (ELECTROSURGICAL) ×1 IMPLANT
GLOVE BIO SURGEON STRL SZ 6 (GLOVE) ×5 IMPLANT
GLOVE BIO SURGEON STRL SZ8 (GLOVE) ×1 IMPLANT
GLOVE BIOGEL PI IND STRL 6.5 (GLOVE) IMPLANT
GLOVE BIOGEL PI IND STRL 8 (GLOVE) IMPLANT
GLOVE BIOGEL PI INDICATOR 6.5 (GLOVE) ×1
GLOVE BIOGEL PI INDICATOR 8 (GLOVE) ×1
GLOVE INDICATOR 6.5 STRL GRN (GLOVE) ×5 IMPLANT
GOWN STRL REUS W/ TWL LRG LVL3 (GOWN DISPOSABLE) ×4 IMPLANT
GOWN STRL REUS W/ TWL XL LVL3 (GOWN DISPOSABLE) IMPLANT
GOWN STRL REUS W/TWL 2XL LVL3 (GOWN DISPOSABLE) ×4 IMPLANT
GOWN STRL REUS W/TWL LRG LVL3 (GOWN DISPOSABLE) ×8
GOWN STRL REUS W/TWL XL LVL3 (GOWN DISPOSABLE) ×4
HEMOSTAT SNOW SURGICEL 2X4 (HEMOSTASIS) ×1 IMPLANT
KIT TURNOVER KIT B (KITS) ×2 IMPLANT
MARKER SKIN DUAL TIP RULER LAB (MISCELLANEOUS) ×1 IMPLANT
NS IRRIG 1000ML POUR BTL (IV SOLUTION) ×4 IMPLANT
PACK COLON (CUSTOM PROCEDURE TRAY) ×2 IMPLANT
PAD ARMBOARD 7.5X6 YLW CONV (MISCELLANEOUS) ×2 IMPLANT
PENCIL BUTTON HOLSTER BLD 10FT (ELECTRODE) ×2 IMPLANT
PENCIL SMOKE EVAC W/HOLSTER (ELECTROSURGICAL) ×1 IMPLANT
SHEARS FOC LG CVD HARMONIC 17C (MISCELLANEOUS) ×1 IMPLANT
SLEEVE SUCTION CATH 165 (SLEEVE) ×1 IMPLANT
SPECIMEN JAR X LARGE (MISCELLANEOUS) ×2 IMPLANT
SPONGE LAP 18X18 RF (DISPOSABLE) IMPLANT
STAPLER VISISTAT 35W (STAPLE) ×2 IMPLANT
SURGILUBE 2OZ TUBE FLIPTOP (MISCELLANEOUS) IMPLANT
SUT PDS AB 1 TP1 96 (SUTURE) ×4 IMPLANT
SUT PROLENE 2 0 CT2 30 (SUTURE) IMPLANT
SUT PROLENE 2 0 KS (SUTURE) IMPLANT
SUT PROLENE 3 0 SH 48 (SUTURE) ×1 IMPLANT
SUT PROLENE 4 0 RB 1 (SUTURE) ×2
SUT PROLENE 4-0 RB1 .5 CRCL 36 (SUTURE) IMPLANT
SUT SILK 2 0 SH (SUTURE) ×1 IMPLANT
SUT VIC AB 2-0 SH 18 (SUTURE) ×2 IMPLANT
SUT VIC AB 3-0 SH 18 (SUTURE) ×2 IMPLANT
SUT VICRYL AB 2 0 TIES (SUTURE) ×2 IMPLANT
SUT VICRYL AB 3 0 TIES (SUTURE) ×2 IMPLANT
TRAY FOLEY MTR SLVR 14FR STAT (SET/KITS/TRAYS/PACK) IMPLANT
TRAY PROCTOSCOPIC FIBER OPTIC (SET/KITS/TRAYS/PACK) IMPLANT
TUBE CONNECTING 12X1/4 (SUCTIONS) ×4 IMPLANT
TUNNELER SHEATH ON-Q 16GX12 DP (PAIN MANAGEMENT) ×1 IMPLANT

## 2018-10-10 NOTE — Progress Notes (Signed)
Pt. Reported she took prescription antibiotics per Dr. Barry Dienes @ 2 pm, 3 pm yesterday , then threw up. Stated she did not take the pills at 2200 that night.

## 2018-10-10 NOTE — Anesthesia Procedure Notes (Signed)
Procedure Name: Intubation Date/Time: 10/10/2018 7:45 AM Performed by: Jenne Campus, CRNA Pre-anesthesia Checklist: Patient identified, Emergency Drugs available, Suction available and Patient being monitored Patient Re-evaluated:Patient Re-evaluated prior to induction Oxygen Delivery Method: Circle System Utilized Preoxygenation: Pre-oxygenation with 100% oxygen Induction Type: IV induction Ventilation: Mask ventilation without difficulty Laryngoscope Size: Miller and 2 Grade View: Grade I Tube type: Oral Tube size: 7.5 mm Number of attempts: 1 Airway Equipment and Method: Stylet and Oral airway Placement Confirmation: ETT inserted through vocal cords under direct vision,  positive ETCO2 and breath sounds checked- equal and bilateral Secured at: 22 cm Tube secured with: Tape Dental Injury: Teeth and Oropharynx as per pre-operative assessment

## 2018-10-10 NOTE — Interval H&P Note (Signed)
History and Physical Interval Note:  10/10/2018 7:27 AM  Brittany Lee  has presented today for surgery, with the diagnosis of RETROPERITONEAL MASS.  The various methods of treatment have been discussed with the patient and family. After consideration of risks, benefits and other options for treatment, the patient has consented to  Procedure(s): RESECTION OF RETROPERITONEAL MASS (Bilateral) as a surgical intervention.  The patient's history has been reviewed, patient examined, no change in status, stable for surgery.  I have reviewed the patient's chart and labs.  Questions were answered to the patient's satisfaction.     [name]

## 2018-10-10 NOTE — Transfer of Care (Signed)
Immediate Anesthesia Transfer of Care Note  Patient: Brittany Lee  Procedure(s) Performed: RESECTION OF RETROPERITONEAL MASS Take Down of Splenic Flexure (Bilateral Abdomen)  Patient Location: PACU  Anesthesia Type:General  Level of Consciousness: oriented, drowsy and patient cooperative  Airway & Oxygen Therapy: Patient Spontanous Breathing and Patient connected to face mask oxygen  Post-op Assessment: Report given to RN and Post -op Vital signs reviewed and stable  Post vital signs: Reviewed  Last Vitals:  Vitals Value Taken Time  BP 125/53 10/10/2018  9:46 AM  Temp    Pulse 78 10/10/2018  9:49 AM  Resp 24 10/10/2018  9:49 AM  SpO2 100 % 10/10/2018  9:49 AM  Vitals shown include unvalidated device data.  Last Pain:  Vitals:   10/10/18 0640  TempSrc:   PainSc: 4          Complications: No apparent anesthesia complications

## 2018-10-10 NOTE — Op Note (Signed)
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  FINDINGS: Large mainly fatty mass with denser core.  Fibrotic on the anterior surface of the kidney, but no invasion into the kidney.  Displaced descending colon medially.  EBL: 50 mL  PROCEDURE:  Patient was identified in the holding area and taken the operating room where she was placed supine on operating table.  General tracheal anesthesia was induced.  A Foley catheter was placed in the left arm was tucked.  The abdomen was then prepped and draped in sterile fashion.  A timeout was performed according to the surgical safety checklist.  When all was correct, we continued.  A midline incision was made in the abdomen over the mass.  The subcutaneous tissues were divided with the cautery.  The fascia was also divided with the cautery.  The peritoneum was entered sharply with Metzenbaum scissors.  The fascial incision was carried out the length of the skin incision.  A Bookwalter retractor was placed to assist with visualization.  The colon was seen to be significantly medialized and anterior from the tumor.  The white line of Toldt was taken down with the cautery.  Care was taken to avoid entering the tumor.  The tumor was originally mobilized lateral to medial.  The sigmoid colon was also partially taken down.  The ureter and gonadal vein was identified and avoided.  The mesocolon  separated easily from the pseudocapsule of the tumor.  The splenic flexure was taken down in order to get these superior aspect of the mass.  The adrenal gland was visualized superiorly.  The perirenal fat was taken with the mass anteriorly.  Medially, there was a large vein that appeared to be directly exiting the mass.  This was triply clipped on the stay side and doubly clipped on the specimen side and then divided.  The mass was carefully examined from all angles and attachments taken down sequentially with the harmonic and the cautery, taking care to go wide away from the mass.  The pseudocapsule of the mass was quite close to the capsule of the kidney, however the kidney was left intact and the fibrotic attachments were divided.  These were marked with a clip.  The mass was eventually divided from all the posterior attachments and was placed into a basin.  The kidney was examined for hemostasis.  There was a little bit of is the inferior aspect of the capsule.  SNOW hemostatic agent was placed around the kidney.  The Doppler was used to examine the pedicle of the kidney.  There was good arterial flow going into the kidney and venous flow exiting the kidney.  A defect in the mesocolon was closed with a running 3-0 Vicryl.  The colon was placed back into its correct anatomic location.  The area was irrigated with water irrigation and then with saline.  There was no evidence of bleeding.  There was no evidence of damage to bowel.  The stomach  was intact and the liver was intact.    On-Q tunneler catheters were then placed into the preperitoneal space from the inferior aspect of the wound bilaterally.  The fascial incision was then closed with running 1 looped PDS sutures x2.  The skin was irrigated and closed with skin staples.  The catheters of the On-Q device were threaded through the tunnelers and the sheaths were pulled away.  These were primed with Marcaine.  The wound was then cleaned, dried, and  dressed with a catheter.  The On-Q catheters were dressed with Steri-Strips and Tegaderm.  The patient was allowed to emerge from anesthesia and was taken to the PACU in stable condition.  Needle, sponge, and instrument counts were correct x2.

## 2018-10-10 NOTE — Anesthesia Postprocedure Evaluation (Signed)
Anesthesia Post Note  Patient: Brittany Lee  Procedure(s) Performed: RESECTION OF RETROPERITONEAL MASS Take Down of Splenic Flexure (Bilateral Abdomen)     Patient location during evaluation: PACU Anesthesia Type: General Level of consciousness: awake and alert Pain management: pain level controlled Vital Signs Assessment: post-procedure vital signs reviewed and stable Respiratory status: spontaneous breathing, nonlabored ventilation, respiratory function stable and patient connected to nasal cannula oxygen Cardiovascular status: blood pressure returned to baseline and stable Postop Assessment: no apparent nausea or vomiting Anesthetic complications: no    Last Vitals:  Vitals:   10/10/18 1200 10/10/18 1232  BP: (!) 115/57 116/71  Pulse: 75 75  Resp: 15 12  Temp:  37.1 C  SpO2: 97% 96%    Last Pain:  Vitals:   10/10/18 1232  TempSrc: Oral  PainSc:                  Karyl Kinnier Ellender

## 2018-10-11 ENCOUNTER — Encounter (HOSPITAL_COMMUNITY): Payer: Self-pay | Admitting: General Surgery

## 2018-10-11 LAB — CBC
HCT: 27.5 % — ABNORMAL LOW (ref 36.0–46.0)
Hemoglobin: 8.7 g/dL — ABNORMAL LOW (ref 12.0–15.0)
MCH: 27.8 pg (ref 26.0–34.0)
MCHC: 31.6 g/dL (ref 30.0–36.0)
MCV: 87.9 fL (ref 80.0–100.0)
Platelets: 315 10*3/uL (ref 150–400)
RBC: 3.13 MIL/uL — AB (ref 3.87–5.11)
RDW: 12.3 % (ref 11.5–15.5)
WBC: 7 10*3/uL (ref 4.0–10.5)
nRBC: 0 % (ref 0.0–0.2)

## 2018-10-11 LAB — BASIC METABOLIC PANEL
Anion gap: 12 (ref 5–15)
BUN: 10 mg/dL (ref 6–20)
CO2: 23 mmol/L (ref 22–32)
Calcium: 8.8 mg/dL — ABNORMAL LOW (ref 8.9–10.3)
Chloride: 100 mmol/L (ref 98–111)
Creatinine, Ser: 0.93 mg/dL (ref 0.44–1.00)
GFR calc Af Amer: >60 mL/min (ref >60)
GFR calc non Af Amer: >60 mL/min (ref >60)
GLUCOSE: 124 mg/dL — AB (ref 70–99)
Potassium: 3.9 mmol/L (ref 3.5–5.1)
Sodium: 135 mmol/L (ref 135–145)

## 2018-10-11 LAB — GLUCOSE, CAPILLARY
Glucose-Capillary: 97 mg/dL (ref 70–99)
Glucose-Capillary: 94 mg/dL (ref 70–99)
Glucose-Capillary: 110 mg/dL — ABNORMAL HIGH (ref 70–99)
Glucose-Capillary: 91 mg/dL (ref 70–99)

## 2018-10-11 NOTE — Progress Notes (Signed)
Pt temp was 103.1, and Yellow MEWS Dr. Brantley Stage notified.  Pt had PRN Tylenol ordered - Dr. Brantley Stage advised to give, with no further orders.

## 2018-10-11 NOTE — Progress Notes (Signed)
1 Day Post-Op   Subjective/Chief Complaint: No n/v.  No belching.  No flatus yet.    Objective: Vital signs in last 24 hours: Temp:  [97 F (36.1 C)-98.7 F (37.1 C)] 98.4 F (36.9 C) (03/27 0450) Pulse Rate:  [58-80] 63 (03/27 0450) Resp:  [12-26] 16 (03/27 0800) BP: (91-125)/(50-71) 96/50 (03/27 0450) SpO2:  [93 %-97 %] 96 % (03/27 0800) Weight:  [91.7 kg] 91.7 kg (03/27 0810) Last BM Date: 10/09/18  Intake/Output from previous day: 03/26 0701 - 03/27 0700 In: 3390 [P.O.:480; I.V.:2910] Out: 1055 [Urine:1005; Blood:50] Intake/Output this shift: No intake/output data recorded.  General appearance: alert, cooperative and no distress Resp: breathing comfortably GI: soft, non distended, approp tender.  OnQ in place.  dressing c/d/i. Extremities: extremities normal, atraumatic, no cyanosis or edema  Lab Results:  Recent Labs    10/08/18 0941 10/11/18 0218  WBC 6.6 7.0  HGB 11.4* 8.7*  HCT 36.4 27.5*  PLT 392 315   BMET Recent Labs    10/08/18 0941 10/11/18 0218  NA 135 135  K 3.6 3.9  CL 104 100  CO2 26 23  GLUCOSE 108* 124*  BUN 15 10  CREATININE 0.81 0.93  CALCIUM 9.6 8.8*   PT/INR Recent Labs    10/08/18 0941  LABPROT 13.4  INR 1.0   ABG No results for input(s): PHART, HCO3 in the last 72 hours.  Invalid input(s): PCO2, PO2  Studies/Results: No results found.  Anti-infectives: Anti-infectives (From admission, onward)   Start     Dose/Rate Route Frequency Ordered Stop   10/10/18 1400  ceFAZolin (ANCEF) IVPB 2g/100 mL premix     2 g 200 mL/hr over 30 Minutes Intravenous Every 8 hours 10/10/18 1200 10/10/18 1530   10/10/18 0630  cefoTEtan in Dextrose 5% (CEFOTAN) IVPB 2 g     2 g 100 mL/hr over 30 Minutes Intravenous To [facility] 10/09/18 0956 10/10/18 0834      Assessment/Plan: s/p Procedure(s): RESECTION OF RETROPERITONEAL MASS Take Down of Splenic Flexure (Bilateral) doing well.  Foley just came out. Pain  tolerable. Advance to full liquids.   Anticipate tomorrow d/c PCA. Home once return of bowel function and tolerating oral meds.    LOS: 1 day    Brittany Lee 10/11/2018

## 2018-10-11 NOTE — TOC Initial Note (Signed)
Transition of Care [facility]) - Initial/Assessment Note    Patient Details  Name: Brittany Lee MRN: [id number] Date of Birth: [birth year]  [facility] CM/SW Contact:    Marilu Favre, RN Phone Number: 10/11/2018, 10:36 AM  Clinical Narrative:                 Confirmed face sheet information with patient.   Patient active with PCP Dr C.Prochnau   Patient lives with her finance . She created an apartment in basement for her mother.  Patient has transportation to appointments and has prescription coverage.  Expected Discharge Plan: Home/Self Care Barriers to Discharge: Continued Medical Work up   Patient Goals and CMS Choice Patient states their goals for this hospitalization and ongoing recovery are:: to go home    Choice offered to / list presented to : NA  Expected Discharge Plan and Services Expected Discharge Plan: Home/Self Care In-house Referral: NA   Post Acute Care Choice: NA Living arrangements for the past 2 months: Single Family Home                 DME Arranged: N/A DME Agency: NA HH Arranged: NA HH Agency: NA  Prior Living Arrangements/Services Living arrangements for the past 2 months: Single Family Home Lives with:: Other (Comment)(lives with finance, and mother ) Patient language and need for interpreter reviewed:: No Do you feel safe going back to the place where you live?: Yes      Need for Family Participation in Patient Care: No (Comment) Care giver support system in place?: No (comment)   Criminal Activity/Legal Involvement Pertinent to Current Situation/Hospitalization: No - Comment as needed  Activities of Daily Living Home Assistive Devices/Equipment: Eyeglasses ADL Screening (condition at time of admission) Patient's cognitive ability adequate to safely complete daily activities?: Yes Is the patient deaf or have difficulty hearing?: No Does the patient have difficulty seeing, even when wearing glasses/contacts?: No Does the  patient have difficulty concentrating, remembering, or making decisions?: No Patient able to express need for assistance with ADLs?: Yes Does the patient have difficulty dressing or bathing?: No Independently performs ADLs?: Yes (appropriate for developmental age) Does the patient have difficulty walking or climbing stairs?: No Weakness of Legs: None Weakness of Arms/Hands: None  Permission Sought/Granted                  Emotional Assessment              Admission diagnosis:  RETROPERITONEAL MASS Patient Active Problem List   Diagnosis Date Noted  . Retroperitoneal mass 10/10/2018  . Cough 07/05/2018  . LPRD (laryngopharyngeal reflux disease) 07/05/2018  . Viral upper respiratory infection 07/05/2018   PCP:  Ernestene Kiel, MD Pharmacy:   [facility] [place] [address] Phone: [phone] Fax: [phone]     Social Determinants of Health (SDOH) Interventions    Readmission Risk Interventions No flowsheet data found.

## 2018-10-11 NOTE — Plan of Care (Signed)
  Problem: Education: Goal: Knowledge of General Education information will improve Description Including pain rating scale, medication(s)/side effects and non-pharmacologic comfort measures Outcome: Progressing   Problem: Clinical Measurements: Goal: Ability to maintain clinical measurements within normal limits will improve Outcome: Progressing Goal: Postoperative complications will be avoided or minimized Outcome: Progressing

## 2018-10-12 LAB — CBC
HCT: 27.9 % — ABNORMAL LOW (ref 36.0–46.0)
Hemoglobin: 8.8 g/dL — ABNORMAL LOW (ref 12.0–15.0)
MCH: 27.9 pg (ref 26.0–34.0)
MCHC: 31.5 g/dL (ref 30.0–36.0)
MCV: 88.6 fL (ref 80.0–100.0)
Platelets: 331 10*3/uL (ref 150–400)
RBC: 3.15 MIL/uL — ABNORMAL LOW (ref 3.87–5.11)
RDW: 12.4 % (ref 11.5–15.5)
WBC: 8.4 10*3/uL (ref 4.0–10.5)
nRBC: 0 % (ref 0.0–0.2)

## 2018-10-12 LAB — BASIC METABOLIC PANEL
Anion gap: 5 (ref 5–15)
BUN: 7 mg/dL (ref 6–20)
CALCIUM: 8.8 mg/dL — AB (ref 8.9–10.3)
CO2: 23 mmol/L (ref 22–32)
CREATININE: 0.98 mg/dL (ref 0.44–1.00)
Chloride: 107 mmol/L (ref 98–111)
GFR calc Af Amer: >60 mL/min (ref >60)
GFR calc non Af Amer: >60 mL/min (ref >60)
Glucose, Bld: 118 mg/dL — ABNORMAL HIGH (ref 70–99)
Potassium: 4.2 mmol/L (ref 3.5–5.1)
Sodium: 135 mmol/L (ref 135–145)

## 2018-10-12 LAB — GLUCOSE, CAPILLARY
Glucose-Capillary: 122 mg/dL — ABNORMAL HIGH (ref 70–99)
Glucose-Capillary: 86 mg/dL (ref 70–99)
Glucose-Capillary: 101 mg/dL — ABNORMAL HIGH (ref 70–99)
Glucose-Capillary: 124 mg/dL — ABNORMAL HIGH (ref 70–99)

## 2018-10-12 MED ORDER — HYDROMORPHONE HCL 1 MG/ML IJ SOLN: 0.5000 mg | INTRAMUSCULAR | Status: DC | PRN

## 2018-10-12 MED ORDER — OXYCODONE HCL 5 MG PO TABS
5.0000 mg | ORAL_TABLET | ORAL | Status: DC | PRN
  Administered 2018-10-12: 5 mg via ORAL
  Administered 2018-10-12: 10 mg via ORAL
  Administered 2018-10-12: 5 mg via ORAL
  Filled 2018-10-12: qty 2
  Filled 2018-10-12 (×2): qty 1

## 2018-10-12 NOTE — Progress Notes (Signed)
Temp of 100.2, no tother symptoms noted. Encouraged use of IS every hour and encouraged ambulation.

## 2018-10-12 NOTE — Progress Notes (Signed)
[facility] Progress Note  2 Days Post-Op  Subjective: CC-  Overall feeling well. Abdomen sore but pain well controlled. Using PCA. Denies n/v. Tolerating full liquids. No flatus or BM.  TMAX 103 over night. She has been using IS more this morning and can pull [phone]. Denies cough, CP, SOB, dysuria. WBC 8.4.  Objective: Vital signs in last 24 hours: Temp:  [97.8 F (36.6 C)-103.1 F (39.5 C)] 100.9 F (38.3 C) (03/28 0537) Pulse Rate:  [69-88] 83 (03/28 0537) Resp:  [17-24] 18 (03/28 0537) BP: (94-108)/(54-63) 108/54 (03/28 0537) SpO2:  [92 %-100 %] 98 % (03/28 0537) Last BM Date: 10/10/18  Intake/Output from previous day: 03/27 0701 - 03/28 0700 In: 2703.3 [P.O.:600; I.V.:2103.3] Out: 1600 [Urine:1600] Intake/Output this shift: Total I/O In: -  Out: 800 [Urine:800]  PE: Gen:  Alert, NAD, pleasant HEENT: EOM's intact, pupils equal and round Card:  RRR Pulm:  CTAB, no W/R/R, effort normal, pulling [phone] on IS Abd: Soft, ND, appropriately tender, few BS heard, OnQ in place, dressing c/d/i with some dried blood noted Psych: A&Ox3  Skin: no rashes noted, warm and dry  Lab Results:  Recent Labs    10/11/18 0218 10/12/18 0309  WBC 7.0 8.4  HGB 8.7* 8.8*  HCT 27.5* 27.9*  PLT 315 331   BMET Recent Labs    10/11/18 0218 10/12/18 0309  NA 135 135  K 3.9 4.2  CL 100 107  CO2 23 23  GLUCOSE 124* 118*  BUN 10 7  CREATININE 0.93 0.98  CALCIUM 8.8* 8.8*   PT/INR No results for input(s): LABPROT, INR in the last 72 hours. CMP     Component Value Date/Time   NA 135 10/12/2018 0309   K 4.2 10/12/2018 0309   CL 107 10/12/2018 0309   CO2 23 10/12/2018 0309   GLUCOSE 118 (H) 10/12/2018 0309   BUN 7 10/12/2018 0309   CREATININE 0.98 10/12/2018 0309   CALCIUM 8.8 (L) 10/12/2018 0309   PROT 7.1 10/08/2018 0941   ALBUMIN 4.0 10/08/2018 0941   AST 19 10/08/2018 0941   ALT 20 10/08/2018 0941   ALKPHOS 49 10/08/2018 0941   BILITOT 0.7  10/08/2018 0941   GFRNONAA >60 10/12/2018 0309   GFRAA >60 10/12/2018 0309   Lipase  No results found for: LIPASE     Studies/Results: No results found.  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 Barry Dienes - POD 2 - Continue full liquids and await return in bowel function. Ambulate. D/c PCA and add oral pain medications. Monitor fever, likely 2/2 atelectasis. No other signs/symptoms of infection. Continue IS/pulmonary toilet. Home once return of bowel function and tolerating oral meds.  ID - none currently FEN - IVF, FLD VTE - SCDs, lovenox Foley - d/c 3/27 Follow up - Dr. Barry Dienes    LOS: 2 days    Wellington Hampshire , [facility] 10/12/2018, 8:56  AM Pager: [phone] Mon-Thurs 7:00 am-4:30 pm Fri 7:00 am -11:30 AM Sat-Sun 7:00 am-11:30 am

## 2018-10-13 LAB — BASIC METABOLIC PANEL
Anion gap: 6 (ref 5–15)
BUN: 5 mg/dL — AB (ref 6–20)
CO2: 27 mmol/L (ref 22–32)
Calcium: 8.9 mg/dL (ref 8.9–10.3)
Chloride: 104 mmol/L (ref 98–111)
Creatinine, Ser: 0.84 mg/dL (ref 0.44–1.00)
GFR calc Af Amer: >60 mL/min (ref >60)
GFR calc non Af Amer: >60 mL/min (ref >60)
Glucose, Bld: 116 mg/dL — ABNORMAL HIGH (ref 70–99)
Potassium: 4.2 mmol/L (ref 3.5–5.1)
Sodium: 137 mmol/L (ref 135–145)

## 2018-10-13 LAB — CBC
HCT: 27 % — ABNORMAL LOW (ref 36.0–46.0)
Hemoglobin: 8.8 g/dL — ABNORMAL LOW (ref 12.0–15.0)
MCH: 28.9 pg (ref 26.0–34.0)
MCHC: 32.6 g/dL (ref 30.0–36.0)
MCV: 88.8 fL (ref 80.0–100.0)
Platelets: 329 10*3/uL (ref 150–400)
RBC: 3.04 MIL/uL — ABNORMAL LOW (ref 3.87–5.11)
RDW: 12.4 % (ref 11.5–15.5)
WBC: 8.4 10*3/uL (ref 4.0–10.5)
nRBC: 0 % (ref 0.0–0.2)

## 2018-10-13 LAB — GLUCOSE, CAPILLARY: Glucose-Capillary: 106 mg/dL — ABNORMAL HIGH (ref 70–99)

## 2018-10-13 MED ORDER — METHOCARBAMOL 500 MG PO TABS: 500.0000 mg | ORAL_TABLET | Freq: Four times a day (QID) | ORAL | 0 refills | Status: DC | PRN

## 2018-10-13 MED ORDER — OXYCODONE HCL 5 MG PO TABS: 5.0000 mg | ORAL_TABLET | Freq: Four times a day (QID) | ORAL | 0 refills | Status: DC | PRN

## 2018-10-13 NOTE — Progress Notes (Signed)
Patient ID: Brittany Lee, female   DOB: 1965-08-24, 53 y.o.   MRN: [id number]   Feeling much better Tolerating po Minimal pain Temps down  Abdomen soft on exam  Plan: Will d/c home

## 2018-10-13 NOTE — Discharge Instructions (Signed)
[facility]      [facility] [phone]  OPEN ABDOMINAL SURGERY: POST OP INSTRUCTIONS  Always review your discharge instruction sheet given to you by the facility where your surgery was performed.  IF YOU HAVE DISABILITY OR FAMILY LEAVE FORMS, YOU MUST BRING THEM TO THE OFFICE FOR PROCESSING.  PLEASE DO NOT GIVE THEM TO YOUR DOCTOR.  1. A prescription for pain medication may be given to you upon discharge.  Take your pain medication as prescribed, if needed.  If narcotic pain medicine is not needed, then you may take acetaminophen (Tylenol) or ibuprofen (Advil) as needed. 2. Take your usually prescribed medications unless otherwise directed. 3. If you need a refill on your pain medication, please contact your pharmacy. They will contact our office to request authorization.  Prescriptions will not be filled after 5pm or on week-ends. 4. You should follow a light diet the first few days after arrival home, such as soup and crackers, pudding, etc.unless your doctor has advised otherwise. A high-fiber, low fat diet can be resumed as tolerated.   Be sure to include lots of fluids daily. Most patients will experience some swelling and bruising on the chest and neck area.  Ice packs will help.  Swelling and bruising can take several days to resolve 5. Most patients will experience some swelling and bruising in the area of the incision. Ice pack will help. Swelling and bruising can take several days to resolve..  6. It is common to experience some constipation if taking pain medication after surgery.  Increasing fluid intake and taking a stool softener will usually help or prevent this problem from occurring.  A mild laxative (Milk of Magnesia or Miralax) should be taken according to package directions if there are no bowel movements after 48 hours. 7.  You may have steri-strips (small skin tapes) in place directly over the incision.  These strips should be left on the skin for 7-10 days.  If your  surgeon used skin glue on the incision, you may shower in 24 hours.  The glue will flake off over the next 2-3 weeks.  Any sutures or staples will be removed at the office during your follow-up visit. You may find that a light gauze bandage over your incision may keep your staples from being rubbed or pulled. You may shower and replace the bandage daily. 8. ACTIVITIES:  You may resume regular (light) daily activities beginning the next day--such as daily self-care, walking, climbing stairs--gradually increasing activities as tolerated.  You may have sexual intercourse when it is comfortable.  Refrain from any heavy lifting or straining until approved by your doctor. a. You may drive when you no longer are taking prescription pain medication, you can comfortably wear a seatbelt, and you can safely maneuver your car and apply brakes b. Return to Work: ___________________________________ 9. You should see your doctor in the office for a follow-up appointment approximately two weeks after your surgery.  Make sure that you call for this appointment within a day or two after you arrive home to insure a convenient appointment time. OTHER INSTRUCTIONS:  _____________________________________________________________ _____________________________________________________________  WHEN TO CALL YOUR DOCTOR: 1. Fever over 101.0 2. Inability to urinate 3. Nausea and/or vomiting 4. Extreme swelling or bruising 5. Continued bleeding from incision. 6. Increased pain, redness, or drainage from the incision. 7. Difficulty swallowing or breathing 8. Muscle cramping or spasms. 9. Numbness or tingling in hands or feet or around lips.  The clinic staff is available to   answer your questions during regular business hours.  Please don't hesitate to call and ask to speak to one of the nurses if you have concerns.  For further questions, please visit [email or web address]   

## 2018-10-13 NOTE — Discharge Summary (Signed)
Physician Discharge Summary  Patient ID: Brittany Lee MRN: [id number] DOB/AGE: 1966/02/04 53 y.o.  Admit date: 10/10/2018 Discharge date: 10/13/2018  Admission Diagnoses:  Discharge Diagnoses:  Active Problems:   Retroperitoneal mass   Discharged Condition: good  Hospital Course: uneventful post op recovery.  Advanced well post op.  Discharged home POD#3 with no further fever and normal WBC  Consults: None  Significant Diagnostic Studies:   Treatments: surgery: excision retroperitoneal mass  Discharge Exam: Blood pressure 124/66, pulse 83, temperature 99.5 F (37.5 C), resp. rate 18, height 5\' 4"  (1.626 m), weight 91.7 kg, SpO2 94 %. General appearance: alert, cooperative and no distress Resp: clear to auscultation bilaterally Cardio: regular rate and rhythm, S1, S2 normal, no murmur, click, rub or gallop Incision/Wound:abdomen soft, incision healing well  Disposition: Discharge disposition: 01-Home or Self Care        Allergies as of 10/13/2018      Reactions   Bupropion Other (See Comments)   "cried very easily"   Sulfa Antibiotics    UNSPECIFIED REACTION    Sulfamethoxazole Rash   Topiramate Other (See Comments)   "mental fogginess"      Medication List    TAKE these medications   ALPRAZolam 1 MG tablet Commonly known as:  XANAX Take 0.5 mg by mouth daily as needed for anxiety.   chlorpheniramine-HYDROcodone 10-8 MG/5ML Suer Commonly known as:  Tussionex Pennkinetic ER Take 2.38ml to 5.59ml every 12 hours as needed for coughing.   fenofibrate 145 MG tablet Commonly known as:  TRICOR Take 145 mg by mouth daily.   hydrochlorothiazide 25 MG tablet Commonly known as:  HYDRODIURIL Take 25 mg by mouth daily.   MEGARED OMEGA-3 KRILL OIL PO Take 1 capsule by mouth daily.   metFORMIN 500 MG 24 hr tablet Commonly known as:  GLUCOPHAGE-XR Take 500 mg by mouth daily.   methocarbamol 500 MG tablet Commonly known as:  ROBAXIN Take 1 tablet (500 mg total)  by mouth every 6 (six) hours as needed for muscle spasms.   multivitamin with minerals Tabs tablet Take 1 tablet by mouth daily.   oxyCODONE 5 MG immediate release tablet Commonly known as:  Oxy IR/ROXICODONE Take 1 tablet (5 mg total) by mouth every 6 (six) hours as needed for moderate pain or severe pain.   pantoprazole 40 MG tablet Commonly known as:  Protonix One tablet twice a day x's 1 month for reflux What changed:    how much to take  how to take this  when to take this  additional instructions   ranitidine 300 MG tablet Commonly known as:  ZANTAC Take 1 tablet (300 mg total) by mouth at bedtime. What changed:    when to take this  reasons to take this   sertraline 50 MG tablet Commonly known as:  ZOLOFT Take 50 mg by mouth daily.   zolpidem 12.5 MG CR tablet Commonly known as:  AMBIEN CR Take 12.5 mg by mouth at bedtime as needed for sleep.      Follow-up Information    [name], MD. Schedule an appointment as soon as possible for a visit in 1 week(s).   Specialty:  General Surgery Contact information: [address] [place] [address] [phone]           Signed: Coralie Keens 10/13/2018, 9:49 AM

## 2018-10-23 ENCOUNTER — Encounter (HOSPITAL_COMMUNITY): Payer: Self-pay

## 2018-10-28 ENCOUNTER — Telehealth: Payer: Self-pay | Admitting: General Surgery

## 2018-10-28 NOTE — Telephone Encounter (Signed)
Left message on machine.

## 2018-11-11 NOTE — Progress Notes (Signed)
Please let patient know final margin was negative, but close.  Will discuss with Dr. Orlene Erm and likely send her for consultation.

## 2018-11-12 DIAGNOSIS — C480 Malignant neoplasm of retroperitoneum: Secondary | ICD-10-CM

## 2019-01-13 ENCOUNTER — Encounter: Payer: Self-pay | Admitting: Gastroenterology

## 2019-01-15 ENCOUNTER — Ambulatory Visit: Payer: Commercial Managed Care - PPO | Admitting: Gastroenterology

## 2019-01-23 ENCOUNTER — Encounter: Payer: Self-pay | Admitting: Allergy and Immunology

## 2019-01-23 ENCOUNTER — Ambulatory Visit: Payer: Commercial Managed Care - PPO | Admitting: Allergy and Immunology

## 2019-01-23 ENCOUNTER — Other Ambulatory Visit: Payer: Self-pay

## 2019-01-23 VITALS — BP 128/88 | HR 84 | Temp 98.1°F | Resp 22

## 2019-01-23 DIAGNOSIS — J069 Acute upper respiratory infection, unspecified: Secondary | ICD-10-CM

## 2019-01-23 DIAGNOSIS — Z20828 Contact with and (suspected) exposure to other viral communicable diseases: Secondary | ICD-10-CM | POA: Diagnosis not present

## 2019-01-23 DIAGNOSIS — K219 Gastro-esophageal reflux disease without esophagitis: Secondary | ICD-10-CM | POA: Diagnosis not present

## 2019-01-23 DIAGNOSIS — J4520 Mild intermittent asthma, uncomplicated: Secondary | ICD-10-CM | POA: Diagnosis not present

## 2019-01-23 DIAGNOSIS — Z20822 Contact with and (suspected) exposure to covid-19: Secondary | ICD-10-CM

## 2019-01-23 MED ORDER — VENTOLIN HFA 108 (90 BASE) MCG/ACT IN AERS: INHALATION_SPRAY | RESPIRATORY_TRACT | 1 refills | Status: DC

## 2019-01-23 MED ORDER — FAMOTIDINE 40 MG PO TABS: ORAL_TABLET | ORAL | 5 refills | Status: AC

## 2019-01-23 NOTE — Progress Notes (Signed)
Brittany Lee   Follow-up Note  Referring Provider: Ernestene Kiel, MD Primary Provider: Ernestene Kiel, MD Date of Office Visit: 01/23/2019  Subjective:   Brittany Lee (DOB: [birth year]) is a 53 y.o. female who returns to the [facility] [place] on 01/23/2019 in re-evaluation of the following:  HPI: Anala presents to this clinic in evaluation of LPR and recurrent cough with a very distant history of asthma.  I have not seen her in this clinic since 25 March 2018.  She did visit with Dr. Maudie Mercury on 05 July 2018 for an episode of cough treated with cough suppressants.  She has really done well since her last visit and has had no significant respiratory tract symptoms while she consistently treats reflux with a proton pump inhibitor.  She has not required a systemic steroid or an antibiotic for any type of respiratory tract issue.  She believes that her reflux is under excellent control.  She continues to drink 1 caffeinated drink per day.  However, 7 days ago she developed sneezing and left work early and then on Saturday had a very bad reflux event with regurgitation and unrelenting coughing.  Since Friday and Saturday she has had continued coughing, some right ear fullness, a headache in her frontal region that appears to increase when she developed significant coughing episodes, and most of her drainage in the back of her throat is been clear although there might be some tinged green and she still has some occasional sneezing.  As well she has detected a "rattle" in her airway.  She has not had any fever or chest pain or ugly sputum production and she has not lost her sense of smell.  She has no other associated systemic or constitutional symptoms.  Allergies as of 01/23/2019      Reactions   Bupropion Other (See Comments)   "cried very easily"   Sulfa Antibiotics    UNSPECIFIED REACTION    Sulfamethoxazole Rash   Topiramate  Other (See Comments)   "mental fogginess"      Medication List    ALPRAZolam 1 MG tablet Commonly known as: XANAX Take 0.5 mg by mouth daily as needed for anxiety.   fenofibrate 145 MG tablet Commonly known as: TRICOR Take 145 mg by mouth daily.   hydrochlorothiazide 25 MG tablet Commonly known as: HYDRODIURIL Take 25 mg by mouth daily.   MEGARED OMEGA-3 KRILL OIL PO Take 1 capsule by mouth daily.   metFORMIN 500 MG 24 hr tablet Commonly known as: GLUCOPHAGE-XR Take 500 mg by mouth daily.   multivitamin with minerals Tabs tablet Take 1 tablet by mouth daily.   pantoprazole 40 MG tablet Commonly known as: Protonix One tablet twice a day x's 1 month for reflux   sertraline 50 MG tablet Commonly known as: ZOLOFT Take 50 mg by mouth daily.   zolpidem 12.5 MG CR tablet Commonly known as: AMBIEN CR Take 12.5 mg by mouth at bedtime as needed for sleep.       Past Medical History:  Diagnosis Date  . Acid reflux   . Anxiety   . Hemorrhoids   . History of kidney stones   . HTN (hypertension)   . Hypercholesteremia   . Insomnia   . Insulin resistance   . Osteoarthritis of knee   . Pneumonia   . Type 2 HSV infection of vulvovaginal region     Past Surgical History:  Procedure Laterality Date  .  APPENDECTOMY  2012  . CARPAL TUNNEL RELEASE Bilateral   . [place]  . ESOPHAGOGASTRODUODENOSCOPY  01/13/2015   Hiatal hernia. Mild gastrtiis.   Marland Kitchen RESECTION OF RETROPERITONEAL MASS Bilateral 10/10/2018   Procedure: RESECTION OF RETROPERITONEAL MASS Take Down of Splenic Flexure;  Surgeon: [name], MD;  Location: [facility];  Service: General;  Laterality: Bilateral;  . 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 TONSILLECTOMY      Review of systems negative except as noted in HPI / PMHx or noted below:  Review of Systems  Constitutional:  Negative.   HENT: Negative.   Eyes: Negative.   Respiratory: Negative.   Cardiovascular: Negative.   Gastrointestinal: Negative.   Genitourinary: Negative.   Musculoskeletal: Negative.   Skin: Negative.   Neurological: Negative.   Endo/Heme/Allergies: Negative.   Psychiatric/Behavioral: Negative.      Objective:   Vitals:   01/23/19 0914  BP: 128/88  Pulse: 84  Resp: (!) 22  Temp: 98.1 F (36.7 C)  SpO2: 94%          Physical Exam Constitutional:      Appearance: She is not diaphoretic.     Comments: coughing  HENT:     Head: Normocephalic.     Right Ear: Tympanic membrane, ear canal and external ear normal.     Left Ear: Tympanic membrane, ear canal and external ear normal.     Nose: Nose normal. No mucosal edema or rhinorrhea.     Mouth/Throat:     Pharynx: Uvula midline. No oropharyngeal exudate.  Eyes:     Conjunctiva/sclera: Conjunctivae normal.  Neck:     Thyroid: No thyromegaly.     Trachea: Trachea normal. No tracheal tenderness or tracheal deviation.  Cardiovascular:     Rate and Rhythm: Normal rate and regular rhythm.     Heart sounds: Normal heart sounds, S1 normal and S2 normal. No murmur.  Pulmonary:     Effort: No respiratory distress.     Breath sounds: Normal breath sounds. No stridor. No wheezing or rales.  Lymphadenopathy:     Head:     Right side of head: No tonsillar adenopathy.     Left side of head: No tonsillar adenopathy.     Cervical: No cervical adenopathy.  Skin:    Findings: No erythema or rash.     Nails: There is no clubbing.   Neurological:     Mental Status: She is alert.     Diagnostics:    Spirometry was performed and demonstrated an FEV1 of 2.29 at 86 % of predicted.  Assessment and Plan:   1. Asthma, mild intermittent, well-controlled   2. LPRD (laryngopharyngeal reflux disease)   3. Viral upper respiratory infection   4. Exposure to Covid-19 Virus     1.  Continue treatment for reflux:   A.  Use  Protonix 40 mg 1-2 times a day depending on reflux activity  B.  Minimize use of caffeine and chocolate  C.  Eliminate use of fish oil  2. For this episode:   A. Increase Protonix to twice a day  B. Add famotidine 40 mg in evening  C. Use nasal saline a few times a day  D. Use a sample of Dulera 100 - 2 inhalations 2 times per day  E. Prednisone 20 mg today and daily for 3 additional days  F. OTC Mucinex DM -  2 tablet 2 times per day  G. Albuterol HFA - 2 inhalations every 4-6 hours if needed.   3.  Return to clinic in 1 year or earlier if problem  4. Blood - COVID IgG  5. Obtain fall flu vaccine (and COVID vaccine)    It appears as though Junelle has developed a viral respiratory tract infection and given her work site, [employer], she may been exposed to [place].  Obtaining a nasal swab at this point will probably be a very little value and she will obtain a COVID IgG titer as it is been approximately 8 days since the onset of her event and she should have made IgG antibodies by this point in time if indeed this was COVID.  She has a long history of very significant reflux induced respiratory disease and we will aggressively treat her for that issue as noted above and she has a very distant history of intermittent asthma and will address inflammation of her airway as noted above.  Assuming she does well I will see her back in this clinic in 1 year or earlier if there is a problem.  Allena Katz, MD Allergy / Immunology [place]

## 2019-01-23 NOTE — Patient Instructions (Addendum)
  1.  Continue treatment for reflux:   A.  Use Protonix 40 mg 1-2 times a day depending on reflux activity  B.  Minimize use of caffeine and chocolate  C. Eliminate use of fish oil  2. For this episode:   A. Increase Protonix to twice a day  B. Add famotidine 40 mg in evening  C. Use nasal saline a few times a day  D. Use a sample of Dulera 100 - 2 inhalations 2 times per day  E. Prednisone 20 mg today and daily for 3 additional days  F. OTC Mucinex DM - 2 tablet 2 times per day  G. Albuterol HFA - 2 inhalations every 4-6 hours if needed.   3.  Return to clinic in 1 year or earlier if problem  4. Blood - COVID IgG  5. Obtain fall flu vaccine (and COVID vaccine)

## 2019-01-24 ENCOUNTER — Telehealth: Payer: Self-pay | Admitting: Allergy and Immunology

## 2019-01-24 ENCOUNTER — Telehealth: Payer: Commercial Managed Care - PPO | Admitting: Gastroenterology

## 2019-01-24 NOTE — Telephone Encounter (Signed)
Brittany Lee was seen by Dr. Neldon Mc on 01/23/19.  [name] called in and stated she was up all night coughing and wants to know if he would call her in some Tussionex. I did inform the patient that Dr. Neldon Mc was out of the office until Monday.  She asked if there was anyone else that could call it in for her.  Please advise.

## 2019-01-24 NOTE — Telephone Encounter (Signed)
Did she start the prednisone?  I would have her take prednisone 20mg  twice a day for next 3 days (instead of daily dose).   Would also recommend mucinex DM 1200mg  twice a day with plenty of water to help with cough.    She should continue her routine medications as directed by Dr. Neldon Mc.

## 2019-01-24 NOTE — Telephone Encounter (Signed)
Spoke with Lattie Haw and we will give her additional prednisone. I also told her to try the Mucinex DM also with lots of water.

## 2019-01-27 ENCOUNTER — Other Ambulatory Visit: Payer: Self-pay

## 2019-01-27 ENCOUNTER — Other Ambulatory Visit: Payer: Self-pay | Admitting: *Deleted

## 2019-01-27 ENCOUNTER — Encounter: Payer: Self-pay | Admitting: Allergy and Immunology

## 2019-01-27 MED ORDER — HYDROCOD POLST-CPM POLST ER 10-8 MG/5ML PO SUER: ORAL | 0 refills | Status: DC

## 2019-01-27 NOTE — Telephone Encounter (Signed)
Brittany Lee calls stating that she has finished the Prednisone and she is still coughing. It is some better but still there. She is doing the Mucinex DM along with all of the other medications instructed to take at her last OV. Please advise.

## 2019-01-27 NOTE — Telephone Encounter (Signed)
RX printed and left up front to be pick up. Left message informing pt.

## 2019-01-27 NOTE — Telephone Encounter (Signed)
She can use Tussionex 2.5-5.0 mL's every 12 hours as needed for cough.  Narcotic warning.  Prescription 60 mL's only no refill.  Did she obtain her blood test for COVID antibodies?

## 2019-02-06 ENCOUNTER — Telehealth: Payer: Self-pay | Admitting: Allergy and Immunology

## 2019-02-06 ENCOUNTER — Other Ambulatory Visit: Payer: Self-pay

## 2019-02-06 MED ORDER — HYDROCOD POLST-CPM POLST ER 10-8 MG/5ML PO SUER: ORAL | 0 refills | Status: DC

## 2019-02-06 MED ORDER — AZITHROMYCIN 500 MG PO TABS: ORAL_TABLET | ORAL | 0 refills | Status: DC

## 2019-02-06 NOTE — Telephone Encounter (Signed)
  Please have Bonnye perform the following              A.  Protonix 40 mg twice a day             B. Famotidine 40 mg in evening             C. Dulera 100 - 2 inhalations 2 times per day             D. OTC Mucinex DM - 2 tablet 2 times per day             E. Albuterol HFA - 2 inhalations every 4-6 hours if needed.  F.  Minimize use of caffeine and chocolate             G. Eliminate use of fish oil  H. Azithromycin 500 mg - 1 tablet 1 time per day for 3 days  I.  Prednisone 20 mg 1 time per day for 3 days  J. Tussionex - 2.5 - 5.0 mls every 12 hours if needed for cough ( 60 ml) NARCOTIC WARNING  Please get her whatever letter she needs for work.

## 2019-02-06 NOTE — Telephone Encounter (Signed)
[name] called in and stated after she started taking the Tussionex, her cough got better but now it is getting worse.  [name] would like to know what to do.  Please advise.

## 2019-02-06 NOTE — Telephone Encounter (Signed)
Patient informed.  She is planning to come by office to pick up Tussionex RX and Prednisone.  Azithromycin sent to [facility]. Work note and print out of Dr.Kozlow's plan will also be given to patient.

## 2019-02-06 NOTE — Telephone Encounter (Signed)
Brittany Lee called back and states that her work sent her home early because of her cough. She is requesting a note excusing her from work as well as a date that she should return.

## 2019-02-14 ENCOUNTER — Telehealth: Payer: Self-pay | Admitting: Allergy and Immunology

## 2019-02-14 DIAGNOSIS — C762 Malignant neoplasm of abdomen: Secondary | ICD-10-CM | POA: Diagnosis not present

## 2019-02-14 NOTE — Telephone Encounter (Signed)
Please ensure she is using Protonix twice a day at this time along with famotidine in evening.   As well as Mucinex twice a day with plenty of water.    Is she needing to use her albuterol inhaler?  If so how often? Did she feel the prednisone helped?  For the hoarseness recommend warm salt gargles and can make honey and lemon mixture to help soothe the throat.    She may need higher dose of her inhaler. Looks like she was provided with a Dulera 100.   If we have a Dulera 200 can offer that or a Symbicort 160.

## 2019-02-14 NOTE — Telephone Encounter (Signed)
Brittany Lee called in and states her cough hasn't gotten any better but it's not getting any worse.  She states she has utilized all her medications and followed Dr. Bruna Potter orders and finished her medications.  She states she is still coughing and now she is hoarse and wants to know what else she can do.  Please advise.

## 2019-02-14 NOTE — Telephone Encounter (Signed)
Ok thanks.  Hopefully the increase inhaled steroid dose will help improve her cough

## 2019-02-14 NOTE — Telephone Encounter (Signed)
FYI: She is doing her Protonix twice daily and her Famotidine each evening. She is using her Albuterol as needed, she states she uses it mostly once daily.   I instructed that she try your suggested for her throat.  She is picking up a sample of Symbicort 160.

## 2019-02-19 ENCOUNTER — Telehealth: Payer: Self-pay

## 2019-02-19 NOTE — Telephone Encounter (Signed)
Covid-19 screening questions   Do you now or have you had a fever in the last 14 days? No  Do you have any respiratory symptoms of shortness of breath or cough now or in the last 14 days? No  Do you have any family members or close contacts with diagnosed or suspected Covid-19 in the past 14 days? No  Have you been tested for Covid-19 and found to be positive? No        

## 2019-02-20 ENCOUNTER — Other Ambulatory Visit: Payer: Self-pay

## 2019-02-20 ENCOUNTER — Ambulatory Visit: Payer: Commercial Managed Care - PPO | Admitting: Gastroenterology

## 2019-02-20 ENCOUNTER — Encounter: Payer: Self-pay | Admitting: Gastroenterology

## 2019-02-20 VITALS — BP 128/88 | HR 80 | Temp 98.2°F | Ht 64.0 in | Wt 212.0 lb

## 2019-02-20 DIAGNOSIS — K219 Gastro-esophageal reflux disease without esophagitis: Secondary | ICD-10-CM | POA: Diagnosis not present

## 2019-02-20 MED ORDER — DEXILANT 60 MG PO CPDR: 60.0000 mg | DELAYED_RELEASE_CAPSULE | Freq: Every day | ORAL | 3 refills | Status: DC

## 2019-02-20 NOTE — Patient Instructions (Signed)
If you are age 52 or older, your body mass index should be between 23-30. Your Body mass index is 36.39 kg/m. If this is out of the aforementioned range listed, please consider follow up with your Primary Care Provider.  If you are age 37 or younger, your body mass index should be between 19-25. Your Body mass index is 36.39 kg/m. If this is out of the aformentioned range listed, please consider follow up with your Primary Care Provider.   We have given you samples of the following medication to take: Dexilant 60 mg once daily.   Continue Pepcid at bedtime.   Stop Protonix.   Please call Dr. Leland Her nurse Karl Pock, RN)  in 2 weeks at [phone]  to let her now how you are doing.    Thank you,  Dr. Jackquline Denmark

## 2019-02-20 NOTE — Progress Notes (Signed)
Chief Complaint:   Referring Provider:  Ernestene Kiel, MD      ASSESSMENT AND PLAN;   #1.  GERD with HH. Occ dysphagia.  #2. 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 No recurrence on CT A/P 02/13/2019. Being followed by Dr Bobby Rumpf. Neg screening colon (Dr Noberto Retort) 10/2017.  Plan: - Dexilant 60mg  po qd. (samples and coupons given). - Continue pepcid 40 mg at night. - Brochures for reflux.  Do not eat 3 hours before going to bed.  Raise the head on the bed. - Stop protonix. - If still with problems in 2 weeks, proceed with EGD with eso Bx and SB Bx. Kai will call and let us know. - Rpt screening colon 10/2027 (per Dr Noberto Retort)   HPI:    Brittany Lee is a 53 y.o. female  With reflux despite Protonix 40 mg p.o. twice daily, Pepcid at night. Seen by Dr. Neldon Mc Occasional dysphagia Recommended GI eval.  Denies having any significant heartburn.  Does have water brushing.  Has problems at night as well.  No odynophagia.  Denies having any abdominal pain.  No significant diarrhea or constipation.  Has occasional hoarseness over the last 1 month.  09/2018-had resection of large retroperitoneal mass.  Biopsies came back as well-differentiated liposarcoma.  Margins were clear.  Being followed by Dr. Bobby Rumpf.  Past GI procedures: -EGD 01/13/2015 3 cm hiatal hernia, mild gastritis. EGD with dil 11/2011-transient Schatzki's ring s/p dil -Colon 10/2017- neg. - CT A/P: Interval resection of large retroperitoneal mass.  Sigmoid diverticulosis, type I hiatal hernia, chronic scarring of right upper kidney. Past Medical History:  Diagnosis Date  . Acid reflux   . Anxiety   . Arthritis   . GERD (gastroesophageal reflux disease)   . Hemorrhoids   . History of kidney stones   . HTN (hypertension)   . Hypercholesteremia   . Insomnia   . Insulin resistance   . Liposarcoma ([place])    in abdominal area-Removed in March 2020  . Osteoarthritis of knee   . Pneumonia   . Type 2  HSV infection of vulvovaginal region   . UTI (urinary tract infection)     Past Surgical History:  Procedure Laterality Date  . APPENDECTOMY  2012  . CARPAL TUNNEL RELEASE Bilateral   . [place]  . COLONOSCOPY  2018   Dr Noberto Retort  . ESOPHAGOGASTRODUODENOSCOPY  01/13/2015   Hiatal hernia. Mild gastrtiis.   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 RESECTION OF RETROPERITONEAL MASS Bilateral 10/10/2018   Procedure: RESECTION OF RETROPERITONEAL MASS Take Down of Splenic Flexure;  Surgeon: [name], MD;  Location: [facility];  Service: General;  Laterality: Bilateral;  . 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 TONSILLECTOMY      Family History  Problem Relation Age of Onset  . Breast cancer Mother   . Kidney cancer Mother   . Diabetes Father   . Heart disease Father   . Colon cancer Neg Hx   . Esophageal cancer Neg Hx     Social History   Tobacco Use  . Smoking status: Never Smoker  . Smokeless tobacco: Never Used  Substance Use Topics  . Alcohol use: No    Frequency: Never  . Drug use: No    Current Outpatient Medications  Medication Sig Dispense Refill  . ALPRAZolam (XANAX) 1 MG tablet  Take 0.5 mg by mouth daily as needed for anxiety.     . budesonide-formoterol (SYMBICORT) 80-4.5 MCG/ACT inhaler Inhale 2 puffs into the lungs 2 (two) times daily.    . famotidine (PEPCID) 40 MG tablet Take one tablet by mouth at bedtime during flare-ups as directed. (Patient taking differently: Take 80 mg by mouth daily as needed. Take one tablet by mouth at bedtime during flare-ups as directed.) 30 tablet 5  . fenofibrate (TRICOR) 145 MG tablet Take 145 mg by mouth daily.     . hydrochlorothiazide (HYDRODIURIL) 25 MG tablet Take 25 mg by mouth daily.    . metFORMIN (GLUCOPHAGE-XR) 500 MG 24 hr tablet Take 500 mg by mouth daily.     . Multiple Vitamin  (MULTIVITAMIN WITH MINERALS) TABS tablet Take 1 tablet by mouth daily.    . pantoprazole (PROTONIX) 40 MG tablet One tablet twice a day x's 1 month for reflux (Patient taking differently: Take 40 mg by mouth daily before breakfast. ) 60 tablet 0  . sertraline (ZOLOFT) 50 MG tablet Take 50 mg by mouth daily.     . VENTOLIN HFA 108 (90 Base) MCG/ACT inhaler Can inhale two puffs every four to six hours as needed for cough or wheeze. 8 g 1  . zolpidem (AMBIEN CR) 12.5 MG CR tablet Take 12.5 mg by mouth at bedtime as needed for sleep.     . chlorpheniramine-HYDROcodone (TUSSIONEX PENNKINETIC ER) 10-8 MG/5ML SUER Take 2.5 to 5 mls every 12 hours if needed for cough. (Patient not taking: Reported on 0/03/8118) 60 mL 0  . TRULICITY 1.47 WG/9.5AO SOPN once a week. Will start on Sunday once a week     No current facility-administered medications for this visit.     Allergies  Allergen Reactions  . Bupropion Other (See Comments)    "cried very easily"  . Sulfa Antibiotics     UNSPECIFIED REACTION   . Sulfamethoxazole Rash  . Topiramate Other (See Comments)    "mental fogginess"    Review of Systems:  Constitutional: Denies fever, chills, diaphoresis, appetite change and fatigue.  HEENT: Denies photophobia, eye pain, redness, hearing loss, ear pain, congestion, sore throat, rhinorrhea, sneezing, mouth sores, neck pain, neck stiffness and tinnitus.   Respiratory: Denies SOB, DOE, cough, chest tightness,  and wheezing.   Cardiovascular: Denies chest pain, palpitations and leg swelling.  Genitourinary: Denies dysuria, urgency, frequency, hematuria, flank pain and difficulty urinating.  Musculoskeletal: Denies myalgias, back pain, joint swelling, arthralgias and gait problem.  Skin: No rash.  Neurological: Denies dizziness, seizures, syncope, weakness, light-headedness, numbness and headaches.  Hematological: Denies adenopathy. Easy bruising, personal or family bleeding history   Psychiatric/Behavioral: No anxiety or depression     Physical Exam:    BP 128/88   Pulse 80   Temp 98.2 F (36.8 C)   Ht 5\' 4"  (1.626 m)   Wt 212 lb (96.2 kg)   BMI 36.39 kg/m  Filed Weights   02/20/19 0916  Weight: 212 lb (96.2 kg)   Constitutional:  Well-developed, in no acute distress. Psychiatric: Normal mood and affect. Behavior is normal. HEENT: Pupils normal.  Conjunctivae are normal. No scleral icterus. Neck supple.  Cardiovascular: Normal rate, regular rhythm. No edema Pulmonary/chest: Effort normal and breath sounds normal. No wheezing, rales or rhonchi. Abdominal: Soft, nondistended. Nontender. Bowel sounds active throughout. There are no masses palpable. No hepatomegaly.  Has well-healed surgical scar. Rectal:  defered Neurological: Alert and oriented to person place and time. Skin: Skin is  warm and dry. No rashes noted.  Data Reviewed: I have personally reviewed following labs and imaging studies    Carmell Austria, MD 02/20/2019, 9:47 AM  Cc: Ernestene Kiel, MD

## 2019-03-03 ENCOUNTER — Telehealth: Payer: Self-pay | Admitting: Gastroenterology

## 2019-03-03 MED ORDER — DEXILANT 60 MG PO CPDR: 60.0000 mg | DELAYED_RELEASE_CAPSULE | Freq: Every day | ORAL | 3 refills | Status: DC

## 2019-03-03 NOTE — Telephone Encounter (Signed)
Sent prescription to patients pharmacy.  

## 2019-04-21 ENCOUNTER — Telehealth: Payer: Self-pay | Admitting: Gastroenterology

## 2019-04-21 DIAGNOSIS — R05 Cough: Secondary | ICD-10-CM

## 2019-04-21 DIAGNOSIS — K219 Gastro-esophageal reflux disease without esophagitis: Secondary | ICD-10-CM

## 2019-04-21 DIAGNOSIS — R059 Cough, unspecified: Secondary | ICD-10-CM

## 2019-04-21 NOTE — Telephone Encounter (Signed)
Pt states that new medication that she was put on is not working. She would like a call back.

## 2019-04-22 NOTE — Telephone Encounter (Signed)
Please call in GI cocktail equal amounts of Mylanta, viscous lidocaine, liquid Donnatal or Bentyl -15 cc p.o. every 8 hours as needed, 120 mL, 2 refills  Proceed with EGD with bx as per previous note.  Thx  RG

## 2019-04-22 NOTE — Telephone Encounter (Signed)
Called and spoke with patient-patient reports she has not seen any improvement with the Dexilant and is still taking the Pepcid at night-  Please advise on alternative medication or if patient needs to be scheduled for EGD with eso bx and sb bx

## 2019-04-23 ENCOUNTER — Telehealth: Payer: Self-pay | Admitting: Gastroenterology

## 2019-04-23 NOTE — Telephone Encounter (Signed)
Left message for patient to call back to the office;   Called and spoke with pharmacist- liquid Donnatal or Bentyl are not available at this time; do you wish to change the RX to viscous lidocaine and mylanta/maalox (compounded)  and then also PO Bentyl?

## 2019-04-23 NOTE — Telephone Encounter (Signed)
Please see additional documentation concerning this patient 

## 2019-04-24 ENCOUNTER — Other Ambulatory Visit: Payer: Self-pay

## 2019-04-24 DIAGNOSIS — K219 Gastro-esophageal reflux disease without esophagitis: Secondary | ICD-10-CM

## 2019-04-29 NOTE — Telephone Encounter (Signed)
Please do Also can you get in touch with [facility] or [facility] and see what they do for GI cocktail.  Please let me know.  The use it very often. RG

## 2019-04-30 NOTE — Telephone Encounter (Signed)
Called and spoke with patient-patient reports she does not need the gi cocktail at this time; patient reports she will call back to the office for a RX if in the future she needs it; patient advised to call back to the office should questions/concerns arise; Patient verbalized understanding of information/instructions;

## 2019-06-06 ENCOUNTER — Encounter: Payer: Commercial Managed Care - PPO | Admitting: Gastroenterology

## 2019-06-17 DIAGNOSIS — C762 Malignant neoplasm of abdomen: Secondary | ICD-10-CM | POA: Diagnosis not present

## 2019-06-25 ENCOUNTER — Other Ambulatory Visit: Payer: Self-pay | Admitting: Gastroenterology

## 2019-10-28 ENCOUNTER — Other Ambulatory Visit: Payer: Self-pay | Admitting: Oncology

## 2019-10-28 DIAGNOSIS — C762 Malignant neoplasm of abdomen: Secondary | ICD-10-CM

## 2019-10-29 ENCOUNTER — Other Ambulatory Visit (HOSPITAL_COMMUNITY): Payer: Self-pay | Admitting: Oncology

## 2019-10-29 DIAGNOSIS — C762 Malignant neoplasm of abdomen: Secondary | ICD-10-CM

## 2019-11-12 ENCOUNTER — Ambulatory Visit
Admission: RE | Admit: 2019-11-12 | Discharge: 2019-11-12 | Disposition: A | Discharge status: Home / Self Care | Payer: Managed Care, Other (non HMO) | Source: Ambulatory Visit | Attending: Oncology | Admitting: Oncology

## 2019-11-12 ENCOUNTER — Other Ambulatory Visit: Payer: Self-pay

## 2019-11-12 DIAGNOSIS — C762 Malignant neoplasm of abdomen: Secondary | ICD-10-CM

## 2019-11-12 MED ORDER — IOPAMIDOL (ISOVUE-300) INJECTION 61%
100.0000 mL | Freq: Once | INTRAVENOUS | Status: AC | PRN
  Administered 2019-11-12: 08:00:00 100 mL via INTRAVENOUS

## 2019-11-14 ENCOUNTER — Ambulatory Visit: Payer: Managed Care, Other (non HMO) | Attending: Internal Medicine

## 2019-11-14 DIAGNOSIS — Z23 Encounter for immunization: Secondary | ICD-10-CM

## 2019-11-14 NOTE — Progress Notes (Signed)
   Covid-19 Vaccination Clinic  Name:  Sarem Sergio    MRN: [id number] DOB: 1966/01/05  11/14/2019  Ms. Biener was observed post Covid-19 immunization for 15 minutes without incident. She was provided with Vaccine Information Sheet and instruction to access the V-Safe system.   Ms. Fette was instructed to call 911 with any severe reactions post vaccine: Marland Kitchen Difficulty breathing  . Swelling of face and throat  . A fast heartbeat  . A bad rash all over body  . Dizziness and weakness   Immunizations Administered    Name Date Dose VIS Date Route   Pfizer COVID-19 Vaccine 11/14/2019  8:11 AM 0.3 mL 09/10/2018 Intramuscular   Manufacturer: [place]   Lot: [id number]   [place]: [id number]

## 2019-11-21 DIAGNOSIS — C762 Malignant neoplasm of abdomen: Secondary | ICD-10-CM | POA: Diagnosis not present

## 2019-12-08 ENCOUNTER — Ambulatory Visit: Payer: Managed Care, Other (non HMO) | Attending: Internal Medicine

## 2019-12-08 DIAGNOSIS — Z23 Encounter for immunization: Secondary | ICD-10-CM

## 2019-12-08 NOTE — Progress Notes (Signed)
   Covid-19 Vaccination Clinic  Name:  Brittany Lee    MRN: [id number] DOB: 01-18-1966  12/08/2019  [name] was observed post Covid-19 immunization for 15 minutes without incident. She was provided with Vaccine Information Sheet and instruction to access the V-Safe system.   [name] was instructed to call 911 with any severe reactions post vaccine: Marland Kitchen Difficulty breathing  . Swelling of face and throat  . A fast heartbeat  . A bad rash all over body  . Dizziness and weakness   Immunizations Administered    Name Date Dose VIS Date Route   Pfizer COVID-19 Vaccine 12/08/2019  4:15 PM 0.3 mL 09/10/2018 Intramuscular   Manufacturer: [place]   Lot: [id number]   [place]: [id number]

## 2020-02-11 ENCOUNTER — Other Ambulatory Visit: Payer: Self-pay | Admitting: Internal Medicine

## 2020-02-11 DIAGNOSIS — Z1231 Encounter for screening mammogram for malignant neoplasm of breast: Secondary | ICD-10-CM

## 2020-02-25 ENCOUNTER — Other Ambulatory Visit: Payer: Self-pay

## 2020-02-25 ENCOUNTER — Ambulatory Visit
Admission: RE | Admit: 2020-02-25 | Discharge: 2020-02-25 | Disposition: A | Discharge status: Home / Self Care | Payer: Managed Care, Other (non HMO) | Source: Ambulatory Visit | Attending: Internal Medicine | Admitting: Internal Medicine

## 2020-02-25 DIAGNOSIS — Z1231 Encounter for screening mammogram for malignant neoplasm of breast: Secondary | ICD-10-CM

## 2020-03-30 ENCOUNTER — Other Ambulatory Visit: Payer: Self-pay

## 2020-03-30 ENCOUNTER — Other Ambulatory Visit: Payer: Self-pay | Admitting: Family

## 2020-03-30 ENCOUNTER — Ambulatory Visit
Admission: RE | Admit: 2020-03-30 | Discharge: 2020-03-30 | Disposition: A | Discharge status: Home / Self Care | Payer: Managed Care, Other (non HMO) | Source: Ambulatory Visit | Attending: Family | Admitting: Family

## 2020-03-30 DIAGNOSIS — R0789 Other chest pain: Secondary | ICD-10-CM

## 2020-05-28 ENCOUNTER — Ambulatory Visit: Payer: Managed Care, Other (non HMO) | Admitting: Oncology

## 2020-05-28 ENCOUNTER — Other Ambulatory Visit: Payer: Self-pay | Admitting: Oncology

## 2020-05-28 ENCOUNTER — Inpatient Hospital Stay: Payer: Managed Care, Other (non HMO) | Admitting: Oncology

## 2020-05-28 DIAGNOSIS — C762 Malignant neoplasm of abdomen: Secondary | ICD-10-CM

## 2020-06-08 ENCOUNTER — Other Ambulatory Visit: Payer: Self-pay | Admitting: Hematology and Oncology

## 2020-06-08 DIAGNOSIS — C762 Malignant neoplasm of abdomen: Secondary | ICD-10-CM

## 2020-06-18 ENCOUNTER — Ambulatory Visit
Admission: RE | Admit: 2020-06-18 | Discharge: 2020-06-18 | Disposition: A | Discharge status: Home / Self Care | Payer: Managed Care, Other (non HMO) | Source: Ambulatory Visit | Attending: Oncology | Admitting: Oncology

## 2020-06-18 DIAGNOSIS — C762 Malignant neoplasm of abdomen: Secondary | ICD-10-CM

## 2020-06-18 MED ORDER — IOPAMIDOL (ISOVUE-300) INJECTION 61%
100.0000 mL | Freq: Once | INTRAVENOUS | Status: AC | PRN
  Administered 2020-06-18: 100 mL via INTRAVENOUS

## 2020-06-21 NOTE — Progress Notes (Signed)
[facility]  [address] [place],  [place]  [address] [phone]  Clinic Day:  06/22/2020  Referring physician: Ernestene Kiel, MD   HISTORY OF PRESENT ILLNESS:  The patient is a 54 y.o. female with stage IB (T4 N0 M0; grade 1) well-differentiated liposarcoma, status post complete surgical resection in March 2020.  The patient comes in today to go over her most recent CT scans to ensure there remains no evidence of disease recurrence.  Since her last visit, the patient has been doing fine.  She has noticed occasional abdominal fullness.  However, she denies having any abdominal pain, nausea, early satiety or other GI symptoms which concern her for disease recurrence.   PHYSICAL EXAM:  Blood pressure (!) 191/102, pulse 86, temperature 98.4 F (36.9 C), resp. rate 14, weight 204 lb 12.8 oz (92.9 kg), SpO2 98 %. Wt Readings from Last 3 Encounters:  06/22/20 204 lb 12.8 oz (92.9 kg)  02/20/19 212 lb (96.2 kg)  10/11/18 202 lb 2.6 oz (91.7 kg)   Body mass index is 35.15 kg/m. Performance status (ECOG): 0 - Asymptomatic Physical Exam Constitutional:      Appearance: Normal appearance. She is not ill-appearing.  HENT:     Mouth/Throat:     Mouth: Mucous membranes are moist.     Pharynx: Oropharynx is clear. No oropharyngeal exudate or posterior oropharyngeal erythema.  Cardiovascular:     Rate and Rhythm: Normal rate and regular rhythm.     Heart sounds: No murmur heard. No friction rub. No gallop.   Pulmonary:     Effort: Pulmonary effort is normal. No respiratory distress.     Breath sounds: Normal breath sounds. No wheezing, rhonchi or rales.  Chest:  Breasts:     Right: No axillary adenopathy or supraclavicular adenopathy.     Left: No axillary adenopathy or supraclavicular adenopathy.    Abdominal:     General: Bowel sounds are normal. There is no distension.     Palpations: Abdomen is soft. There is no mass.     Tenderness: There  is no abdominal tenderness.  Musculoskeletal:        General: No swelling.     Right lower leg: No edema.     Left lower leg: No edema.  Lymphadenopathy:     Cervical: No cervical adenopathy.     Upper Body:     Right upper body: No supraclavicular or axillary adenopathy.     Left upper body: No supraclavicular or axillary adenopathy.     Lower Body: No right inguinal adenopathy. No left inguinal adenopathy.  Skin:    General: Skin is warm.     Coloration: Skin is not jaundiced.     Findings: No lesion or rash.  Neurological:     General: No focal deficit present.     Mental Status: She is alert and oriented to person, place, and time. Mental status is at baseline.     Cranial Nerves: Cranial nerves are intact.  Psychiatric:        Mood and Affect: Mood normal.        Behavior: Behavior normal.        Thought Content: Thought content normal.     LABS:   CBC Latest Ref Rng & Units 06/22/2020 10/13/2018 10/12/2018  WBC - 5.4 8.4 8.4  Hemoglobin 12.0 - 16.0 14.9 8.8(L) 8.8(L)  Hematocrit 36 - 46 41 27.0(L) 27.9(L)  Platelets 150 - 399 227 329 331   CMP  Latest Ref Rng & Units 06/22/2020 10/13/2018 10/12/2018  Glucose 70 - 99 mg/dL - 116(H) 118(H)  BUN 4 - 21 11 5(L) 7  Creatinine 0.5 - 1.1 0.7 0.84 0.98  Sodium 137 - 147 140 137 135  Potassium 3.4 - 5.3 3.7 4.2 4.2  Chloride 99 - 108 100 104 107  CO2 13 - 22 29(A) 27 23  Calcium 8.7 - 10.7 10.0 8.9 8.8(L)  Total Protein 6.5 - 8.1 g/dL - - -  Total Bilirubin 0.3 - 1.2 mg/dL - - -  Alkaline Phos 25 - 125 97 - -  AST 13 - 35 30 - -  ALT 7 - 35 30 - -   STUDIES:  CT ABDOMEN PELVIS W CONTRAST  Result Date: 06/18/2020 CLINICAL DATA:  History of liposarcoma with excision and post appendectomy follow-up evaluation in this 54 year old female. EXAM: CT ABDOMEN AND PELVIS WITH CONTRAST TECHNIQUE: Multidetector CT imaging of the abdomen and pelvis was performed using the standard protocol following bolus administration of intravenous  contrast. CONTRAST:  138mL ISOVUE-300 IOPAMIDOL (ISOVUE-300) INJECTION 61% COMPARISON:  11/12/2019 FINDINGS: Lower chest: Basilar atelectasis. No consolidation. No pleural effusion. Hepatobiliary: No focal, suspicious hepatic lesion. Portal vein is patent. Hepatic veins are patent. No pericholecystic stranding or gallbladder distension. No biliary duct dilation. Pancreas: Normal appearance of the pancreas without ductal dilation or inflammation. Spleen: Normal appearance of the spleen. Adrenals/Urinary Tract: Adrenal glands are normal. Renal cortical scarring about the upper pole the RIGHT kidney is similar to the prior study. Mild scarring associated with the anterior LEFT kidney with postoperative changes of LEFT retroperitoneal resection showing a similar appearance. Fat density without signs of soft tissue interdigitate between colon and anterior LEFT kidney, a margin of the spleen and pancreas without change this area measuring 8.9 x 5.7 cm is stable. Stranding along the anterior margin of the LEFT kidney is also a stable finding. Otherwise with symmetric renal enhancement. No sign of hydronephrosis. Stomach/Bowel: Small hiatal hernia. Similar displacement of bowel loops in the LEFT upper quadrant following retroperitoneal mass resection. This has however shown slow increase over time, as compared to February 13, 2019 there is further displacement of bowel loops in the LEFT upper quadrant. There is also in addition of plaque-like thickening of soft tissue along the anterior margin of the LEFT kidney subtle anterior extension of soft tissue best seen on image 38 of series 2 which again is stable since the prior exam though not definitely seen on the exam of November of 2020. No acute small bowel process no acute colonic finding. Scattered colonic diverticulosis of the sigmoid colon. Vascular/Lymphatic: Patent abdominal vasculature. There is no gastrohepatic or hepatoduodenal ligament lymphadenopathy. No  retroperitoneal or mesenteric lymphadenopathy. Reproductive: Normal appearance of uterus and adnexa. No pelvic sidewall lymphadenopathy. Other: Postoperative changes in the midline.  No ascites. Musculoskeletal: No acute musculoskeletal process. Spinal degenerative changes. IMPRESSION: 1. Postoperative changes of LEFT retroperitoneal resection. 2. Increasing volume of fat in the LEFT upper quadrant displacing bowel loops with subtle soft tissue without frankly nodular features but with subtle thickening of fascial planes best appreciated on coronal images, concerning for disease recurrence, up lifting pancreas and displacing bowel loops in the LEFT upper quadrant. Comparison particularly with imaging from July of 2020 shows increase in fat density in this area. Perhaps PET scan could be helpful given the subtle soft tissue along the anterior margin of the kidney that is contiguous with fascial thickening. Alternatively short interval follow-up could be performed to assess for  further change. 3. No acute intra-abdominal process. 4. Small hiatal hernia. Electronically Signed   By: Zetta Bills M.D.   On: 06/18/2020 12:33      ASSESSMENT & PLAN:  Assessment/Plan:  A 54 y.o. female with stage IB liposarcoma.  In clinic today, I went over her CT scan images with her, for which she could see a vague, transparent-looking area of fullness in her left upper quadrant that appears to be displacing bowel within the region.  Although this vague area does not radiographically resemble the liposarcoma that was resected last year, it is in the same area.  An abdominal MRI will be done next to better delineate what this area of fullness represents.  Ultimately, the patient understands a second surgery or biopsy may be necessary to definitively determine what this area represents.  I will see her back next week to go her MRI results and their implications.  The patient understands all the plans discussed today and is in  agreement with them.    Devann Cribb Macarthur Critchley, MD

## 2020-06-22 ENCOUNTER — Encounter: Payer: Self-pay | Admitting: Oncology

## 2020-06-22 ENCOUNTER — Inpatient Hospital Stay: Payer: Managed Care, Other (non HMO)

## 2020-06-22 ENCOUNTER — Other Ambulatory Visit: Payer: Self-pay | Admitting: Oncology

## 2020-06-22 ENCOUNTER — Other Ambulatory Visit: Payer: Self-pay | Admitting: Hematology and Oncology

## 2020-06-22 ENCOUNTER — Other Ambulatory Visit: Payer: Self-pay

## 2020-06-22 ENCOUNTER — Inpatient Hospital Stay: Payer: Managed Care, Other (non HMO) | Attending: Oncology | Admitting: Oncology

## 2020-06-22 VITALS — BP 191/102 | HR 86 | Temp 98.4°F | Resp 14 | Wt 204.8 lb

## 2020-06-22 DIAGNOSIS — C499 Malignant neoplasm of connective and soft tissue, unspecified: Secondary | ICD-10-CM | POA: Diagnosis not present

## 2020-06-22 DIAGNOSIS — C762 Malignant neoplasm of abdomen: Secondary | ICD-10-CM

## 2020-06-22 LAB — HEPATIC FUNCTION PANEL
ALT: 30 (ref 7–35)
AST: 30 (ref 13–35)
Alkaline Phosphatase: 97 (ref 25–125)
Bilirubin, Total: 0.7

## 2020-06-22 LAB — CBC
MCV: 89 (ref 76–111)
RBC: 4.65 (ref 3.87–5.11)

## 2020-06-22 LAB — BASIC METABOLIC PANEL WITH GFR
BUN: 11 (ref 4–21)
CO2: 29 — AB (ref 13–22)
Chloride: 100 (ref 99–108)
Creatinine: 0.7 (ref 0.5–1.1)
Glucose: 91
Potassium: 3.7 (ref 3.4–5.3)
Sodium: 140 (ref 137–147)

## 2020-06-22 LAB — COMPREHENSIVE METABOLIC PANEL WITH GFR
Albumin: 4.8 (ref 3.5–5.0)
Calcium: 10 (ref 8.7–10.7)

## 2020-06-22 LAB — CBC AND DIFFERENTIAL
HCT: 41 (ref 36–46)
Hemoglobin: 14.9 (ref 12.0–16.0)
Neutrophils Absolute: 2.75
Platelets: 227 (ref 150–399)
WBC: 5.4

## 2020-06-22 MED ORDER — LORAZEPAM 1 MG PO TABS: 1.0000 mg | ORAL_TABLET | Freq: Once | ORAL | 0 refills | Status: AC

## 2020-08-11 ENCOUNTER — Encounter: Payer: Self-pay | Admitting: Oncology

## 2020-08-13 ENCOUNTER — Telehealth: Payer: Self-pay | Admitting: Oncology

## 2020-08-13 NOTE — Telephone Encounter (Signed)
Patient called to schedule Follow Up at having MRI on 1/29  Appt made for 2/2 at 4:30 pm

## 2020-08-14 ENCOUNTER — Other Ambulatory Visit: Payer: Managed Care, Other (non HMO)

## 2020-08-16 ENCOUNTER — Other Ambulatory Visit: Payer: Self-pay

## 2020-08-16 ENCOUNTER — Telehealth: Payer: Self-pay | Admitting: Oncology

## 2020-08-16 ENCOUNTER — Ambulatory Visit (INDEPENDENT_AMBULATORY_CARE_PROVIDER_SITE_OTHER): Payer: Managed Care, Other (non HMO) | Admitting: Allergy and Immunology

## 2020-08-16 ENCOUNTER — Encounter: Payer: Self-pay | Admitting: Allergy and Immunology

## 2020-08-16 VITALS — BP 122/80 | HR 108 | Resp 16 | Ht 62.0 in | Wt 216.0 lb

## 2020-08-16 DIAGNOSIS — K219 Gastro-esophageal reflux disease without esophagitis: Secondary | ICD-10-CM

## 2020-08-16 DIAGNOSIS — J4521 Mild intermittent asthma with (acute) exacerbation: Secondary | ICD-10-CM

## 2020-08-16 DIAGNOSIS — J988 Other specified respiratory disorders: Secondary | ICD-10-CM

## 2020-08-16 DIAGNOSIS — B9789 Other viral agents as the cause of diseases classified elsewhere: Secondary | ICD-10-CM

## 2020-08-16 MED ORDER — HYDROCOD POLST-CPM POLST ER 10-8 MG/5ML PO SUER: ORAL | 0 refills | Status: DC

## 2020-08-16 NOTE — Telephone Encounter (Signed)
08/16/20 Spoke with patient and resch appt

## 2020-08-16 NOTE — Patient Instructions (Addendum)
  1.  Continue treatment for reflux:   A.  Use Protonix 40 mg 1-2 times a day depending on reflux activity  2. For this episode:   A. Increase Protonix to twice a day  B. Add famotidine 40 mg in evening  C. Use nasal saline a few times a day  D. Use a sample of BREZTRI - 2 inhalations 2 times per day  E. Prednisone 20 mg today and daily for 3 additional days  F. Tussionex - 5.0 mL every 12 hours if needed. NARCOTIC  G. Albuterol HFA - 2 inhalations every 4-6 hours if needed.   3.  Return to clinic in 1 year or earlier if problem

## 2020-08-16 NOTE — Progress Notes (Unsigned)
[name]   Follow-up Note  Referring Provider: Ernestene Kiel, MD Primary Provider: Ernestene Kiel, MD Date of Office Visit: 08/16/2020  Subjective:   Brittany Lee (DOB: [birth year]) is a 55 y.o. female who returns to the [facility] on 08/16/2020 in re-evaluation of the following:  HPI: Savvy returns to this clinic in evaluation of asthma and allergic rhinitis and LPR.  I have not seen her in this clinic since 23 January 2019.  Overall she has done pretty well with an episode of "bronchitis" that usually develops 1 or 2 times per year that is usually controlled with some systemic steroid and an antibiotic and a little bit more aggressive therapy directed against her reflux induced respiratory disease.  At the beginning of January 2022 she developed 1 of these episodes and was treated with 2 rounds of prednisone for total of 12 days and was given a Z-Pak and apparently was Covid negative at that point.  She was improving and was definitely getting a lot better until this weekend.  Over the course the past 48 hours she has had unrelenting coughing spells where she has been up for hours.  She has no other associated respiratory tract symptoms.  She does not have any nasal congestion or anosmia or ugly nasal discharge or sputum production or chest pain or wheezing or shortness of breath if she is not coughing.  Allergies as of 08/16/2020      Reactions   Bupropion Other (See Comments)   "cried very easily"   Sulfa Antibiotics    UNSPECIFIED REACTION    Sulfamethoxazole Rash   Topiramate Other (See Comments)   "mental fogginess"      Medication List      ALPRAZolam 1 MG tablet Commonly known as: XANAX Take 0.5 mg by mouth daily as needed for anxiety.   atorvastatin 10 MG tablet Commonly known as: LIPITOR Take 10 mg by mouth daily.   famotidine 40 MG tablet Commonly known as: PEPCID Take one tablet by mouth  at bedtime during flare-ups as directed.   fenofibrate 145 MG tablet Commonly known as: TRICOR Take 145 mg by mouth daily.   hydrochlorothiazide 25 MG tablet Commonly known as: HYDRODIURIL Take 25 mg by mouth daily.   multivitamin with minerals Tabs tablet Take 1 tablet by mouth daily.   pantoprazole 40 MG tablet Commonly known as: Protonix One tablet twice a day x's 1 month for reflux   Ventolin HFA 108 (90 Base) MCG/ACT inhaler Generic drug: albuterol Can inhale two puffs every four to six hours as needed for cough or wheeze.   zolpidem 12.5 MG CR tablet Commonly known as: AMBIEN CR Take 12.5 mg by mouth at bedtime as needed for sleep.       Past Medical History:  Diagnosis Date  . Acid reflux   . Anxiety   . Arthritis   . GERD (gastroesophageal reflux disease)   . Hemorrhoids   . History of kidney stones   . HTN (hypertension)   . Hypercholesteremia   . Insomnia   . Insulin resistance   . Liposarcoma ([facility])    in abdominal area-Removed in March 2020  . Osteoarthritis of knee   . Pneumonia   . Type 2 HSV infection of vulvovaginal region   . UTI (urinary tract infection)     Past Surgical History:  Procedure Laterality Date  . APPENDECTOMY  2012  . CARPAL TUNNEL RELEASE Bilateral   .  [facility]  . COLONOSCOPY  11/02/2017   Dr Noberto Retort  . ESOPHAGOGASTRODUODENOSCOPY  01/13/2015   Hiatal hernia. Mild gastrtiis.   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 RESECTION OF RETROPERITONEAL MASS Bilateral 10/10/2018   Procedure: RESECTION OF RETROPERITONEAL MASS Take Down of Splenic Flexure;  Surgeon: [name], MD;  Location: [facility];  Service: General;  Laterality: Bilateral;  . 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 TONSILLECTOMY      Review of systems negative except as noted in HPI / PMHx or noted below:  Review of Systems   Constitutional: Negative.   HENT: Negative.   Eyes: Negative.   Respiratory: Negative.   Cardiovascular: Negative.   Gastrointestinal: Negative.   Genitourinary: Negative.   Musculoskeletal: Negative.   Skin: Negative.   Neurological: Negative.   Endo/Heme/Allergies: Negative.   Psychiatric/Behavioral: Negative.      Objective:   Vitals:   08/16/20 1548  BP: 122/80  Pulse: (!) 108  Resp: 16  SpO2: 95%   Height: 5\' 2"  (157.5 cm)  Weight: 216 lb (98 kg)   Physical Exam Constitutional:      Appearance: She is not diaphoretic.     Comments: Unrelenting coughing  HENT:     Head: Normocephalic.     Right Ear: Tympanic membrane, ear canal and external ear normal.     Left Ear: Tympanic membrane, ear canal and external ear normal.     Nose: Nose normal. No mucosal edema or rhinorrhea.     Mouth/Throat:     Mouth: Oropharynx is clear and moist and mucous membranes are normal.     Pharynx: Uvula midline. No oropharyngeal exudate.  Eyes:     Conjunctiva/sclera: Conjunctivae normal.  Neck:     Thyroid: No thyromegaly.     Trachea: Trachea normal. No tracheal tenderness or tracheal deviation.  Cardiovascular:     Rate and Rhythm: Normal rate and regular rhythm.     Heart sounds: Normal heart sounds, S1 normal and S2 normal. No murmur heard.   Pulmonary:     Effort: No respiratory distress.     Breath sounds: Normal breath sounds. No stridor. No wheezing or rales.  Musculoskeletal:        General: No edema.  Lymphadenopathy:     Head:     Right side of head: No tonsillar adenopathy.     Left side of head: No tonsillar adenopathy.     Cervical: No cervical adenopathy.  Skin:    Findings: No erythema or rash.     Nails: There is no clubbing.  Neurological:     Mental Status: She is alert.     Diagnostics: [employer] Bianex Now Covid swab NEGATIVE  Assessment and Plan:   1. Asthma, not well controlled, mild intermittent, with acute exacerbation   2. LPRD  (laryngopharyngeal reflux disease)   3. Viral respiratory illness     1.  Continue treatment for reflux:   A.  Use Protonix 40 mg 1-2 times a day depending on reflux activity  2. For this episode:   A. Increase Protonix to twice a day  B. Add famotidine 40 mg in evening  C. Use nasal saline a few times a day  D. Use a sample of BREZTRI - 2 inhalations 2 times per day  E. Prednisone 20 mg today and daily for 3 additional days  F. Tussionex -  5.0 mL every 12 hours if needed. NARCOTIC  G. Albuterol HFA - 2 inhalations every 4-6 hours if needed.   3.  Return to clinic in 1 year or earlier if problem    Shikita has another one of her coughing episodes with a very irritated airway and probably some degree of inflammation and we will treat her with the therapy noted above which includes therapy directed against both inflammation and reflux induced respiratory disease and I have given her a narcotic-based cough suppressant with a narcotic warning to help her through the next week or so.  Assuming she does well with this plan I will see her back in this clinic in 1 year or earlier if there is a problem.  Allena Katz, MD [facility]

## 2020-08-17 ENCOUNTER — Encounter: Payer: Self-pay | Admitting: Allergy and Immunology

## 2020-08-18 ENCOUNTER — Ambulatory Visit: Payer: Managed Care, Other (non HMO) | Admitting: Oncology

## 2020-08-20 ENCOUNTER — Telehealth: Payer: Self-pay

## 2020-08-20 MED ORDER — BENZONATATE 200 MG PO CAPS: 200.0000 mg | ORAL_CAPSULE | Freq: Three times a day (TID) | ORAL | 0 refills | Status: DC | PRN

## 2020-08-20 NOTE — Telephone Encounter (Signed)
Can you please make sure she is using pantoprazole twice a day as well as famotidine 40 mg once a day? Thank you

## 2020-08-20 NOTE — Telephone Encounter (Signed)
Patient saw Dr. Neldon Mc on Monday and called with update.  She has finished the Prednisone and is still using the reflux treatment, Breztri, Albuterol, nasal saline, and Tussionex as prescribed.  She is still having really bad coughing spells.  The cough is not quite as deep as it was before, but activity/movement/talking seem to trigger the coughing spells, and still having some shortness of breath.  She is sore from coughing.  She wanted to know if Benzonatate would be helpful or if we had anything else we could call in to help her.  Please advise.

## 2020-08-20 NOTE — Addendum Note (Signed)
Addended by: Zandra Abts on: 08/20/2020 12:06 PM   Modules accepted: Orders

## 2020-08-20 NOTE — Telephone Encounter (Signed)
Thank you. Please have her continue all of her medications as listed. Please call in benzonatate 200 mg three times a day as needed for cough. Please have her call the clinic with worsening symptoms or fever. Thank you. Please go over dietary and lifestyle modifications for reflux with her and mail them to her listed address too. Thank you   Lifestyle Changes for Controlling GERD When you have GERD, stomach acid feels as if it's backing up toward your mouth. Whether or not you take medication to control your GERD, your symptoms can often be improved with lifestyle changes.   Raise Your Head Reflux is more likely to strike when you're lying down flat, because stomach fluid can flow backward more easily. Raising the head of your bed 4-6 inches can help. To do this: Slide blocks or books under the legs at the head of your bed. Or, place a wedge under the mattress. Many foam stores can make a suitable wedge for you. The wedge should run from your waist to the top of your head. Don't just prop your head on several pillows. This increases pressure on your stomach. It can make GERD worse.  Watch Your Eating Habits Certain foods may increase the acid in your stomach or relax the lower esophageal sphincter, making GERD more likely. It's best to avoid the following: Coffee, tea, and carbonated drinks (with and without caffeine) Fatty, fried, or spicy food Mint, chocolate, onions, and tomatoes Any other foods that seem to irritate your stomach or cause you pain  Relieve the Pressure Eat smaller meals, even if you have to eat more often. Don't lie down right after you eat. Wait a few hours for your stomach to empty. Avoid tight belts and tight-fitting clothes. Lose excess weight.  Tobacco and Alcohol Avoid smoking tobacco and drinking alcohol. They can make GERD symptoms worse.

## 2020-08-20 NOTE — Telephone Encounter (Signed)
Patient informed of Anne's message.  I briefly reviewed the reflux information.  Patient says she has MyChart and is ok with me sending the reflux information to her via that route.  Germaine Pomfret for Benzonatate sent to [facility]

## 2020-08-20 NOTE — Telephone Encounter (Signed)
Yes she is using them, Pantoprazole BID and Famotidine QD.

## 2020-09-04 ENCOUNTER — Ambulatory Visit
Admission: RE | Admit: 2020-09-04 | Discharge: 2020-09-04 | Disposition: A | Discharge status: Home / Self Care | Payer: Managed Care, Other (non HMO) | Source: Ambulatory Visit | Attending: Oncology | Admitting: Oncology

## 2020-09-04 ENCOUNTER — Other Ambulatory Visit: Payer: Self-pay

## 2020-09-04 DIAGNOSIS — C762 Malignant neoplasm of abdomen: Secondary | ICD-10-CM

## 2020-09-04 MED ORDER — GADOBENATE DIMEGLUMINE 529 MG/ML IV SOLN
20.0000 mL | Freq: Once | INTRAVENOUS | Status: AC | PRN
  Administered 2020-09-04: 20 mL via INTRAVENOUS

## 2020-09-06 ENCOUNTER — Other Ambulatory Visit: Payer: Self-pay

## 2020-09-06 ENCOUNTER — Other Ambulatory Visit: Payer: Self-pay | Admitting: Oncology

## 2020-09-06 ENCOUNTER — Inpatient Hospital Stay: Payer: Managed Care, Other (non HMO) | Attending: Oncology | Admitting: Oncology

## 2020-09-06 DIAGNOSIS — C762 Malignant neoplasm of abdomen: Secondary | ICD-10-CM

## 2020-09-06 DIAGNOSIS — C482 Malignant neoplasm of peritoneum, unspecified: Secondary | ICD-10-CM

## 2020-09-06 NOTE — Progress Notes (Signed)
[facility]  [address] [place],  [place]  [address] [phone]  Clinic Day:  09/06/2020  Referring physician: Ernestene Kiel, MD   HISTORY OF PRESENT ILLNESS:  The patient is a 55 y.o. female with stage IB (T4 N0 M0; grade 1) well-differentiated liposarcoma, status post complete surgical resection in March 2020.  As her recent  CT scan showed a questionable mass in her left upper quadrant, an abdominal MRI was done for further evaluation.  She comes in today to go over her MRI results and their implications.  Since her last visit, the patient has been doing fine.  She still has occasional abdominal fullness/early satiety.  However, these symptoms are not as severe as they were when her liposarcoma was found 2 years ago.  PHYSICAL EXAM:  Blood pressure (!) 181/100, pulse 96, temperature 98.2 F (36.8 C), resp. rate 16, height 5\' 2"  (1.575 m), weight 215 lb (97.5 kg), SpO2 95 %. Wt Readings from Last 3 Encounters:  09/06/20 215 lb (97.5 kg)  08/16/20 216 lb (98 kg)  06/22/20 204 lb 12.8 oz (92.9 kg)   Body mass index is 39.32 kg/m. Performance status (ECOG): 0 - Asymptomatic Physical Exam Constitutional:      Appearance: Normal appearance. She is not ill-appearing.  HENT:     Mouth/Throat:     Mouth: Mucous membranes are moist.     Pharynx: Oropharynx is clear. No oropharyngeal exudate or posterior oropharyngeal erythema.  Cardiovascular:     Rate and Rhythm: Normal rate and regular rhythm.     Heart sounds: No murmur heard. No friction rub. No gallop.   Pulmonary:     Effort: Pulmonary effort is normal. No respiratory distress.     Breath sounds: Normal breath sounds. No wheezing, rhonchi or rales.  Chest:  Breasts:     Right: No axillary adenopathy or supraclavicular adenopathy.     Left: No axillary adenopathy or supraclavicular adenopathy.    Abdominal:     General: Bowel sounds are normal. There is no distension.      Palpations: Abdomen is soft. There is no mass.     Tenderness: There is no abdominal tenderness.  Musculoskeletal:        General: No swelling.     Right lower leg: No edema.     Left lower leg: No edema.  Lymphadenopathy:     Cervical: No cervical adenopathy.     Upper Body:     Right upper body: No supraclavicular or axillary adenopathy.     Left upper body: No supraclavicular or axillary adenopathy.     Lower Body: No right inguinal adenopathy. No left inguinal adenopathy.  Skin:    General: Skin is warm.     Coloration: Skin is not jaundiced.     Findings: No lesion or rash.  Neurological:     General: No focal deficit present.     Mental Status: She is alert and oriented to person, place, and time. Mental status is at baseline.     Cranial Nerves: Cranial nerves are intact.  Psychiatric:        Mood and Affect: Mood normal.        Behavior: Behavior normal.        Thought Content: Thought content normal.    STUDIES:  MR Abdomen W Wo Contrast  Result Date: 09/05/2020 CLINICAL DATA:  History of retroperitoneal liposarcoma. EXAM: MRI ABDOMEN WITHOUT AND WITH CONTRAST TECHNIQUE: Multiplanar multisequence MR imaging of the  abdomen was performed both before and after the administration of intravenous contrast. CONTRAST:  69mL MULTIHANCE GADOBENATE DIMEGLUMINE 529 MG/ML IV SOLN COMPARISON:  CT scan 06/18/2020. FINDINGS: Lower chest: Unremarkable. Hepatobiliary: No suspicious focal abnormality within the liver parenchyma. There is no evidence for gallstones, gallbladder wall thickening, or pericholecystic fluid. No intrahepatic or extrahepatic biliary dilation. Pancreas: No focal mass lesion. No dilatation of the main duct. No intraparenchymal cyst. No peripancreatic edema. Spleen:  No splenomegaly. No focal mass lesion. Adrenals/Urinary Tract: No adrenal nodule or mass. Cortical scarring again noted upper pole right kidney and lower pole left kidney. No suspicious renal mass.  Stomach/Bowel: Stomach is unremarkable. No gastric wall thickening. No evidence of outlet obstruction. Duodenum is normally positioned as is the ligament of Treitz. No small bowel or colonic dilatation within the visualized abdomen. Vascular/Lymphatic: No abdominal aortic aneurysm. No abdominal lymphadenopathy Other: As noted on recent CT scan, there is an area of fat in the left abdomen generating mass-effect on the left colon and spleen as well as the stomach and pancreatic tail. Previous CT from 06/18/2020 suggested some progression in this finding since July of 2020. Direct comparison of today's MRI with slight motion degradation to those prior CTs is suboptimal. There is a central 12 mm soft tissue nodular component within this fat density that does enhance after IV contrast administration but may represent a vessel. Otherwise, there is no substantial soft tissue components or soft tissue enhancement tracking through this region of fat density. Musculoskeletal: No focal suspicious marrow enhancement within the visualized bony anatomy. IMPRESSION: 1. Similar findings on MRI today as discussed on the CT abdomen/pelvis report of 06/18/2020. Fat density in the region of liposarcoma resection appears minimally progressive since 06/16/2019. However, no obvious enhancing soft tissue nodularity or soft tissue elements within this region. Close continued follow-up recommended. Electronically Signed   By: Misty Stanley M.D.   On: 09/05/2020 17:12      ASSESSMENT & PLAN:  Assessment/Plan:  A 55 y.o. female with stage IB liposarcoma.  In clinic today, I went over her MRI images with her, for which she can see fatty tissue deposition within her left upper quadrant.  Her MRI images do not look much different than her recent CT scan images.  Her current images definitely do not resemble how her liposarcoma looked 2 years ago.  Nevertheless, as some type of adiposity is in the region, this area needs to be sampled to  determine its etiology. The patient is likely looking at interventional radiology performing a biopsy of the fatty deposition in the region.  General surgery will see her tomorrow and follow up with her on this biopsy.  Otherwise, I will see her back in 4 months for repeat clinical assessment.   Barring any changes, a repeat abdominal MRI will be done before her next visit to ascertain her new disease baseline. The patient understands all the plans discussed today and is in agreement with them.    Avian Konigsberg Macarthur Critchley, MD

## 2020-09-07 ENCOUNTER — Ambulatory Visit: Payer: Managed Care, Other (non HMO) | Admitting: Oncology

## 2020-09-07 DIAGNOSIS — C482 Malignant neoplasm of peritoneum, unspecified: Secondary | ICD-10-CM | POA: Insufficient documentation

## 2020-09-13 ENCOUNTER — Other Ambulatory Visit (HOSPITAL_COMMUNITY): Payer: Self-pay | Admitting: General Surgery

## 2020-09-13 DIAGNOSIS — C480 Malignant neoplasm of retroperitoneum: Secondary | ICD-10-CM

## 2020-09-15 ENCOUNTER — Encounter (HOSPITAL_COMMUNITY): Payer: Self-pay

## 2020-09-15 NOTE — Progress Notes (Signed)
L[facility]    2       Brittany Lee Female, 55 y.o., 04-27-1966  MRN:  [id number] Phone:  [phone] Jerilynn Mages)       PCP:  Ernestene Kiel, MD Coverage:  Cigna/Cigna Managed  Next Appt With Radiology ([facility]-CT 3) 09/20/2020 at 11:00 AM           RE: Biopsy Received: Today  Message Details  Sandi Mariscal, MD  Lenore Cordia OK for CT guided L RP mass Bx.   Enlarging L RP macroscopic fat containing mass in pt with history of liposarcoma.   Would probably attempt to Bx soft tissue thickening anterior to the left kidney - image 38, series 2 of abdominal CT from 06/18/20 as it is new from CT 06/16/19.   Otherwise, would attempt CT guided Bx of enlarging component superiorly (image 26, series 2 of abdominal CT from 06/18/20.   Or both areas....   Cathren Harsh    Previous Messages  ----- Message -----  From: Lenore Cordia  Sent: 09/15/2020 10:02 AM EST  To: Ir Procedure Requests  Subject: FW: Biopsy                     ----- Message -----  From: Lenore Cordia  Sent: 09/13/2020  3:34 PM EST  To: [facility] Procedure Requests  Subject: Biopsy                      Procedure Requested: US Biopsy   Reason for Procedure: intraabdominal/retroperitoneal fatty mass. Please biopsy. ok o do CT guided if requested    Provider Requesting: [name]  Provider Telephone: [phone]    Other Info:

## 2020-09-20 ENCOUNTER — Other Ambulatory Visit: Payer: Self-pay | Admitting: Student

## 2020-09-20 ENCOUNTER — Ambulatory Visit (HOSPITAL_COMMUNITY)
Admission: RE | Admit: 2020-09-20 | Discharge: 2020-09-20 | Disposition: A | Discharge status: Home / Self Care | Payer: Managed Care, Other (non HMO) | Source: Ambulatory Visit | Attending: General Surgery | Admitting: General Surgery

## 2020-09-20 ENCOUNTER — Other Ambulatory Visit: Payer: Self-pay

## 2020-09-20 ENCOUNTER — Encounter (HOSPITAL_COMMUNITY): Payer: Self-pay

## 2020-09-20 DIAGNOSIS — Z79899 Other long term (current) drug therapy: Secondary | ICD-10-CM | POA: Insufficient documentation

## 2020-09-20 DIAGNOSIS — Z8051 Family history of malignant neoplasm of kidney: Secondary | ICD-10-CM | POA: Diagnosis not present

## 2020-09-20 DIAGNOSIS — C480 Malignant neoplasm of retroperitoneum: Secondary | ICD-10-CM | POA: Diagnosis not present

## 2020-09-20 DIAGNOSIS — Z9049 Acquired absence of other specified parts of digestive tract: Secondary | ICD-10-CM | POA: Insufficient documentation

## 2020-09-20 DIAGNOSIS — Z882 Allergy status to sulfonamides status: Secondary | ICD-10-CM | POA: Diagnosis not present

## 2020-09-20 DIAGNOSIS — Z803 Family history of malignant neoplasm of breast: Secondary | ICD-10-CM | POA: Diagnosis not present

## 2020-09-20 DIAGNOSIS — R1909 Other intra-abdominal and pelvic swelling, mass and lump: Secondary | ICD-10-CM | POA: Diagnosis present

## 2020-09-20 DIAGNOSIS — Z8719 Personal history of other diseases of the digestive system: Secondary | ICD-10-CM | POA: Insufficient documentation

## 2020-09-20 LAB — CBC
HCT: 37.2 % (ref 36.0–46.0)
Hemoglobin: 13.1 g/dL (ref 12.0–15.0)
MCH: 31.5 pg (ref 26.0–34.0)
MCHC: 35.2 g/dL (ref 30.0–36.0)
MCV: 89.4 fL (ref 80.0–100.0)
Platelets: 199 10*3/uL (ref 150–400)
RBC: 4.16 MIL/uL (ref 3.87–5.11)
RDW: 12.7 % (ref 11.5–15.5)
WBC: 5 10*3/uL (ref 4.0–10.5)
nRBC: 0 % (ref 0.0–0.2)

## 2020-09-20 LAB — APTT: aPTT: 25 seconds (ref 24–36)

## 2020-09-20 LAB — PROTIME-INR
INR: 1 (ref 0.8–1.2)
Prothrombin Time: 12.3 seconds (ref 11.4–15.2)

## 2020-09-20 MED ORDER — FENTANYL CITRATE (PF) 100 MCG/2ML IJ SOLN
INTRAMUSCULAR | Status: AC
  Filled 2020-09-20: qty 2

## 2020-09-20 MED ORDER — FENTANYL CITRATE (PF) 100 MCG/2ML IJ SOLN
INTRAMUSCULAR | Status: AC | PRN
  Administered 2020-09-20 (×2): 25 ug via INTRAVENOUS
  Administered 2020-09-20: 50 ug via INTRAVENOUS

## 2020-09-20 MED ORDER — SODIUM CHLORIDE 0.9 % IV SOLN: INTRAVENOUS | Status: DC

## 2020-09-20 MED ORDER — MIDAZOLAM HCL 2 MG/2ML IJ SOLN
INTRAMUSCULAR | Status: AC | PRN
  Administered 2020-09-20 (×3): 1 mg via INTRAVENOUS

## 2020-09-20 MED ORDER — LIDOCAINE HCL 1 % IJ SOLN
INTRAMUSCULAR | Status: AC
  Filled 2020-09-20: qty 20

## 2020-09-20 MED ORDER — MIDAZOLAM HCL 2 MG/2ML IJ SOLN
INTRAMUSCULAR | Status: AC
  Filled 2020-09-20: qty 4

## 2020-09-20 NOTE — Procedures (Signed)
Interventional Radiology Procedure Note  Procedure: [place] BX LEFT RP FATTY MASS    Complications: None  Estimated Blood Loss:  0  Findings: STRANDY FATTY FRAGMENTS OF 18 G CORE BXS PATH PENDING H/O LOW GRADE LIPOSARCOMA     Tamera Punt, MD

## 2020-09-20 NOTE — Discharge Instructions (Signed)

## 2020-09-20 NOTE — H&P (Signed)
Chief Complaint: Patient was seen in consultation today for retroperitoneal mass biopsy at the request of Henefer  Referring Physician(s): Byerly,Faera  Supervising Physician: Daryll Brod  Patient Status: [facility] - Out-pt  History of Present Illness: Brittany Lee is a 55 y.o. female   Hx Liposarcoma resection 09/2018 Follows with Dr Argentina Donovan and Dr Barry Dienes Pt has been feeling "bloated " as she did in 2020.  CT 06/2020: IMPRESSION: 1. Postoperative changes of LEFT retroperitoneal resection. 2. Increasing volume of fat in the LEFT upper quadrant displacing bowel loops with subtle soft tissue without frankly nodular features but with subtle thickening of fascial planes best appreciated on coronal images, concerning for disease recurrence, up lifting pancreas and displacing bowel loops in the LEFT upper quadrant. Comparison particularly with imaging from July of 2020 shows increase in fat density in this area. Perhaps PET scan could be helpful given the subtle soft tissue along the anterior margin of the kidney that is contiguous with fascial thickening. Alternatively short interval follow-up could be performed to assess for further change. 3. No acute intra-abdominal process. 4. Small hiatal hernia.  MRI 08/2020:  IMPRESSION: 1. Similar findings on MRI today as discussed on the CT abdomen/pelvis report of 06/18/2020. Fat density in the region of liposarcoma resection appears minimally progressive since 06/16/2019. However, no obvious enhancing soft tissue nodularity or soft tissue elements within this region. Close continued follow-up Recommended.  Scheduled now for biopsy of RP mass   Past Medical History:  Diagnosis Date  . Acid reflux   . Anxiety   . Arthritis   . GERD (gastroesophageal reflux disease)   . Hemorrhoids   . History of kidney stones   . HTN (hypertension)   . Hypercholesteremia   . Insomnia   . Insulin resistance   . Liposarcoma ([place])     in abdominal area-Removed in March 2020  . Osteoarthritis of knee   . Pneumonia   . Type 2 HSV infection of vulvovaginal region   . UTI (urinary tract infection)     Past Surgical History:  Procedure Laterality Date  . APPENDECTOMY  2012  . CARPAL TUNNEL RELEASE Bilateral   . [place]  . COLONOSCOPY  11/02/2017   Dr Noberto Retort  . ESOPHAGOGASTRODUODENOSCOPY  01/13/2015   Hiatal hernia. Mild gastrtiis.   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 RESECTION OF RETROPERITONEAL MASS Bilateral 10/10/2018   Procedure: RESECTION OF RETROPERITONEAL MASS Take Down of Splenic Flexure;  Surgeon: [name], MD;  Location: [facility];  Service: General;  Laterality: Bilateral;  . 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 TONSILLECTOMY      Allergies: Bupropion, Sulfa antibiotics, Sulfamethoxazole, and Topiramate  Medications: Prior to Admission medications   Medication Sig Start Date End Date Taking? Authorizing Provider  atorvastatin (LIPITOR) 10 MG tablet Take 10 mg by mouth daily. 08/05/20  Yes [provider]  hydrochlorothiazide (HYDRODIURIL) 25 MG tablet Take 25 mg by mouth daily.   Yes [provider]  Multiple Vitamin (MULTIVITAMIN WITH MINERALS) TABS tablet Take 1 tablet by mouth daily.   Yes [provider]  pantoprazole (PROTONIX) 40 MG tablet One tablet twice a day x's 1 month for reflux Patient taking differently: Take 40 mg by mouth daily before breakfast. 07/05/18  Yes Garnet Sierras, DO  ALPRAZolam Duanne Moron) 1 MG tablet Take 0.5 mg by mouth daily as needed  for anxiety.  08/13/17   [provider]  benzonatate (TESSALON) 200 MG capsule Take 1 capsule (200 mg total) by mouth 3 (three) times daily as needed for cough. 08/20/20   Dara Hoyer, FNP  chlorpheniramine-HYDROcodone (TUSSIONEX PENNKINETIC ER) 10-8 MG/5ML SUER Can take 5  mL by mouth every 12 hours if needed for cough.  Narcotic Warning. 08/16/20   Kozlow, Donnamarie Poag, MD  famotidine (PEPCID) 40 MG tablet Take one tablet by mouth at bedtime during flare-ups as directed. Patient taking differently: Take 40 mg by mouth daily as needed. Take one tablet by mouth at bedtime during flare-ups as directed. 01/23/19   Kozlow, Donnamarie Poag, MD  fenofibrate (TRICOR) 145 MG tablet Take 145 mg by mouth daily.  07/04/17   [provider]  VENTOLIN HFA 108 (90 Base) MCG/ACT inhaler Can inhale two puffs every four to six hours as needed for cough or wheeze. 01/23/19   Kozlow, Donnamarie Poag, MD  zolpidem (AMBIEN CR) 12.5 MG CR tablet Take 12.5 mg by mouth at bedtime as needed for sleep.  07/25/17   [provider]     Family History  Problem Relation Age of Onset  . Breast cancer Mother   . Kidney cancer Mother   . Diabetes Father   . Heart disease Father   . Colon cancer Neg Hx   . Esophageal cancer Neg Hx     Social History   Socioeconomic History  . Marital status: Married    Spouse name: Not on file  . Number of children: Not on file  . Years of education: Not on file  . Highest education level: Not on file  Occupational History  . Not on file  Tobacco Use  . Smoking status: Never Smoker  . Smokeless tobacco: Never Used  Vaping Use  . Vaping Use: Never used  Substance and Sexual Activity  . Alcohol use: No  . Drug use: No  . Sexual activity: Not on file  Other Topics Concern  . Not on file  Social History Narrative  . Not on file   Social Determinants of Health   Financial Resource Strain: Not on file  Food Insecurity: Not on file  Transportation Needs: Not on file  Physical Activity: Not on file  Stress: Not on file  Social Connections: Not on file    Review of Systems: A 12 point ROS discussed and pertinent positives are indicated in the HPI above.  All other systems are negative.  Review of Systems  Constitutional: Negative for activity change,  fatigue and fever.  Respiratory: Negative for cough and shortness of breath.   Gastrointestinal: Negative for abdominal pain, nausea and vomiting.  Psychiatric/Behavioral: Negative for behavioral problems and confusion.    Vital Signs: BP 112/80   Pulse 79   Temp 98.1 F (36.7 C) (Oral)   Resp 14   Ht 5\' 3"  (1.6 m)   Wt 215 lb (97.5 kg)   SpO2 97%   BMI 38.09 kg/m   Physical Exam Vitals reviewed.  HENT:     Mouth/Throat:     Mouth: Mucous membranes are moist.  Cardiovascular:     Rate and Rhythm: Normal rate and regular rhythm.     Heart sounds: Normal heart sounds.  Pulmonary:     Effort: Pulmonary effort is normal.     Breath sounds: Normal breath sounds.  Abdominal:     General: There is no distension.     Palpations: Abdomen is soft.  Tenderness: There is no abdominal tenderness.  Musculoskeletal:        General: Normal range of motion.  Skin:    General: Skin is warm.  Neurological:     Mental Status: She is alert and oriented to person, place, and time.  Psychiatric:        Behavior: Behavior normal.     Imaging: MR Abdomen W Wo Contrast  Result Date: 09/05/2020 CLINICAL DATA:  History of retroperitoneal liposarcoma. EXAM: MRI ABDOMEN WITHOUT AND WITH CONTRAST TECHNIQUE: Multiplanar multisequence MR imaging of the abdomen was performed both before and after the administration of intravenous contrast. CONTRAST:  51mL MULTIHANCE GADOBENATE DIMEGLUMINE 529 MG/ML IV SOLN COMPARISON:  CT scan 06/18/2020. FINDINGS: Lower chest: Unremarkable. Hepatobiliary: No suspicious focal abnormality within the liver parenchyma. There is no evidence for gallstones, gallbladder wall thickening, or pericholecystic fluid. No intrahepatic or extrahepatic biliary dilation. Pancreas: No focal mass lesion. No dilatation of the main duct. No intraparenchymal cyst. No peripancreatic edema. Spleen:  No splenomegaly. No focal mass lesion. Adrenals/Urinary Tract: No adrenal nodule or mass.  Cortical scarring again noted upper pole right kidney and lower pole left kidney. No suspicious renal mass. Stomach/Bowel: Stomach is unremarkable. No gastric wall thickening. No evidence of outlet obstruction. Duodenum is normally positioned as is the ligament of Treitz. No small bowel or colonic dilatation within the visualized abdomen. Vascular/Lymphatic: No abdominal aortic aneurysm. No abdominal lymphadenopathy Other: As noted on recent CT scan, there is an area of fat in the left abdomen generating mass-effect on the left colon and spleen as well as the stomach and pancreatic tail. Previous CT from 06/18/2020 suggested some progression in this finding since July of 2020. Direct comparison of today's MRI with slight motion degradation to those prior CTs is suboptimal. There is a central 12 mm soft tissue nodular component within this fat density that does enhance after IV contrast administration but may represent a vessel. Otherwise, there is no substantial soft tissue components or soft tissue enhancement tracking through this region of fat density. Musculoskeletal: No focal suspicious marrow enhancement within the visualized bony anatomy. IMPRESSION: 1. Similar findings on MRI today as discussed on the CT abdomen/pelvis report of 06/18/2020. Fat density in the region of liposarcoma resection appears minimally progressive since 06/16/2019. However, no obvious enhancing soft tissue nodularity or soft tissue elements within this region. Close continued follow-up recommended. Electronically Signed   By: Misty Stanley M.D.   On: 09/05/2020 17:12    Labs:  CBC: Recent Labs    06/22/20 0000  WBC 5.4  HGB 14.9  HCT 41  PLT 227    COAGS: No results for input(s): INR, APTT in the last 8760 hours.  BMP: Recent Labs    06/22/20 0000  NA 140  K 3.7  CL 100  CO2 29*  BUN 11  CALCIUM 10.0  CREATININE 0.7    LIVER FUNCTION TESTS: Recent Labs    06/22/20 0000  AST 30  ALT 30  ALKPHOS 97   ALBUMIN 4.8    TUMOR MARKERS: No results for input(s): AFPTM, CEA, CA199, CHROMGRNA in the last 8760 hours.  Assessment and Plan:  Hx liposarcoma resection 2020 Follow up imaging in 12/21- revealed mass on CT MRI confirmation Now scheduled for biopsy per Dr Barry Dienes request Risks and benefits of retroperitoneal mass bx was discussed with the patient and/or patient's family including, but not limited to bleeding, infection, damage to adjacent structures or low yield requiring additional tests.  All of the  questions were answered and there is agreement to proceed. Consent signed and in chart.  Thank you for this interesting consult.  I greatly enjoyed meeting Fortunata Betty and look forward to participating in their care.  A copy of this report was sent to the requesting provider on this date.  Electronically Signed: Lavonia Drafts, PA-C 09/20/2020, 9:58 AM   I spent a total of  30 Minutes   in face to face in clinical consultation, greater than 50% of which was counseling/coordinating care for retroperitoneal mass bx

## 2020-09-28 ENCOUNTER — Encounter: Payer: Self-pay | Admitting: Allergy and Immunology

## 2020-09-29 ENCOUNTER — Telehealth: Payer: Self-pay | Admitting: *Deleted

## 2020-09-29 MED ORDER — BENZONATATE 200 MG PO CAPS: 200.0000 mg | ORAL_CAPSULE | Freq: Three times a day (TID) | ORAL | 0 refills | Status: DC | PRN

## 2020-09-29 MED ORDER — PANTOPRAZOLE SODIUM 40 MG PO TBEC: DELAYED_RELEASE_TABLET | ORAL | 1 refills | Status: DC

## 2020-09-29 NOTE — Telephone Encounter (Signed)
-----   Message from Jiles Prows, MD sent at 09/29/2020  8:06 AM EDT ----- My cough was almost gone then had a coughing spell again over the weekend.  No weezing but coughing spells especially bad at night.  Would it be possible to have the Benzonatate called in again and double up on the Protonix?  If so, I am almost out of the protonix by doubling up last time.  Can I get another refill for two times a day?  If possible, can I can have called into [facility]?

## 2020-09-29 NOTE — Telephone Encounter (Signed)
Refills sent to the pharmacy per Dr. Bruna Potter instructions.

## 2020-10-07 LAB — SURGICAL PATHOLOGY

## 2020-10-08 ENCOUNTER — Encounter (HOSPITAL_COMMUNITY): Payer: Self-pay

## 2020-10-08 ENCOUNTER — Other Ambulatory Visit: Payer: Self-pay | Admitting: Hematology and Oncology

## 2020-10-08 ENCOUNTER — Telehealth: Payer: Self-pay | Admitting: General Surgery

## 2020-10-08 DIAGNOSIS — C762 Malignant neoplasm of abdomen: Secondary | ICD-10-CM

## 2020-10-08 NOTE — Telephone Encounter (Signed)
Discussed path with patient.  Will refer to Dr. Orlene Erm to consider pre op XRT.  Discussed with Dr. Orlene Erm as well.

## 2020-10-22 ENCOUNTER — Ambulatory Visit (HOSPITAL_COMMUNITY): Payer: Managed Care, Other (non HMO)

## 2020-12-17 ENCOUNTER — Telehealth: Payer: Self-pay | Admitting: Oncology

## 2020-12-17 NOTE — Telephone Encounter (Signed)
12/17/20 Spoke with patient and stated that Dr Bobby Rumpf referred her to [facility].All appts cancelled

## 2020-12-24 ENCOUNTER — Other Ambulatory Visit: Payer: Self-pay | Admitting: Allergy and Immunology

## 2021-01-04 ENCOUNTER — Ambulatory Visit: Payer: Managed Care, Other (non HMO) | Admitting: Oncology

## 2021-04-22 ENCOUNTER — Other Ambulatory Visit: Payer: Self-pay | Admitting: Internal Medicine

## 2021-04-22 DIAGNOSIS — Z1231 Encounter for screening mammogram for malignant neoplasm of breast: Secondary | ICD-10-CM

## 2021-04-30 ENCOUNTER — Ambulatory Visit
Admission: RE | Admit: 2021-04-30 | Discharge: 2021-04-30 | Disposition: A | Discharge status: Home / Self Care | Payer: Managed Care, Other (non HMO) | Source: Ambulatory Visit | Attending: Internal Medicine | Admitting: Internal Medicine

## 2021-04-30 DIAGNOSIS — Z1231 Encounter for screening mammogram for malignant neoplasm of breast: Secondary | ICD-10-CM

## 2021-05-23 ENCOUNTER — Encounter: Payer: Self-pay | Admitting: Gastroenterology

## 2021-05-23 ENCOUNTER — Other Ambulatory Visit: Payer: Self-pay

## 2021-05-23 ENCOUNTER — Ambulatory Visit (INDEPENDENT_AMBULATORY_CARE_PROVIDER_SITE_OTHER): Payer: Managed Care, Other (non HMO) | Admitting: Gastroenterology

## 2021-05-23 VITALS — BP 128/86 | HR 90 | Ht 63.0 in | Wt 185.0 lb

## 2021-05-23 DIAGNOSIS — K2100 Gastro-esophageal reflux disease with esophagitis, without bleeding: Secondary | ICD-10-CM

## 2021-05-23 DIAGNOSIS — R1310 Dysphagia, unspecified: Secondary | ICD-10-CM

## 2021-05-23 DIAGNOSIS — K449 Diaphragmatic hernia without obstruction or gangrene: Secondary | ICD-10-CM

## 2021-05-23 MED ORDER — DICYCLOMINE HCL 10 MG PO CAPS: 10.0000 mg | ORAL_CAPSULE | Freq: Three times a day (TID) | ORAL | 0 refills | Status: DC

## 2021-05-23 MED ORDER — FERROUS FUMARATE 324 (106 FE) MG PO TABS: 1.0000 | ORAL_TABLET | Freq: Every day | ORAL | 6 refills | Status: DC

## 2021-05-23 NOTE — Patient Instructions (Signed)
If you are age 54 or older, your body mass index should be between 23-30. Your Body mass index is 32.77 kg/m. If this is out of the aforementioned range listed, please consider follow up with your Primary Care Provider.  If you are age 53 or younger, your body mass index should be between 19-25. Your Body mass index is 32.77 kg/m. If this is out of the aformentioned range listed, please consider follow up with your Primary Care Provider.   __________________________________________________________  The [facility] providers would like to encourage you to use [facility] to communicate with providers for non-urgent requests or questions.  Due to long hold times on the telephone, sending your provider a message by [facility] may be a faster and more efficient way to get a response.  Please allow 48 business hours for a response.  Please remember that this is for non-urgent requests.   We have sent the following medications to your pharmacy for you to pick up at your convenience:  Dicyclomine 10mg  three times daily for cramping Iron supplements  It was a pleasure to see you today!  Jackquline Denmark, M.D.

## 2021-05-23 NOTE — Progress Notes (Signed)
Chief Complaint:   Referring Provider:  Ernestene Kiel, MD      ASSESSMENT AND PLAN;   #1.  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  #2. Abn CT showing "colitis". Neg stool studies for C. difficile. Note that she had preoperative XRT 12/2020 to left abdomen. Neg screening colon 10/2017  Plan:  - Stop A/Bs - Trial of bentyl 10mg  TID #90 2 refills - She has FU Dr. Kateri Mc at [facility] in Jan 2023.  Further GI eval by means of EGD/colon only when oncology would recommend. - Start iron supplements for anemia - FU in Feb/March 2023.  Certainly, earlier if with any GI problems.   HPI:    Brittany Lee is a 55 y.o. female  With well-differentiated liposarcoma s/p resection x 2 with left partial colectomy, left nephrectomy (most recently 02/2021)  Gen abdo discomfort with abdominal bloating.  Hb 10.8 (04/13/2021), recheck in October 2022 was 10.2 with borderline MCV.  No melena or hematochezia.  She does have fatigue.  Occ diarrhea but better now with abn CT showing "colitis".- neg stool for C Diff, GI pathogens- treated with cipro/flagyl.  Today she is on 13th of 14-day course.  She denies having any nausea, vomiting, heartburn, regurgitation, odynophagia or dysphagia currently.  No fever or chills.  Have reviewed CT Abdo/pelvis with patient in detail.   Past GI procedures: -EGD 01/13/2015 3 cm hiatal hernia, mild gastritis. EGD with dil 11/2011-transient Schatzki's ring s/p dil -Colon 10/2017- neg (Dr Noberto Retort) [place]  CT AP 04/13/2021 1.  Interval resection of left retroperitoneal sarcoma, left nephrectomy,  left adrenalectomy, and partial colectomy. Small volume free fluid in the  left upper quadrant and pelvis is likely postsurgical.  2.  Mild wall thickening of the transverse and proximal  descending colon  which may also be reactive versus a mild nonspecific colitis.  3.  Peripheral wedge-shaped area of hypoenhancement in the spleen which may  represent a splenic infarct versus postsurgical change.  4.  Moderate left pleural effusion. CT AP 04/2021 1. Left-sided colonic inflammatory or infectious process. 2. Small amount of free abdominal and free pelvic fluid without definite peritoneal enhancement to suggest abscess or peritonitis. 3. Status post left nephrectomy. 4. Small focal triangular area of decreased perfusion involving the anterior lower aspect of the spleen most likely a small splenic infarct.    Oncology history WDLS of the retroperitoneum: --09/2018 presented with abdominal distention and early satiety x 3 months --09/2018 MRI reportedly showed 23 x 14 x 21 cm left RP mass c/f RP liposarcoma --09/2018 OR (Dr. [name]): excision of RP mass, 23 cm. Clear margins. --10/2019 CT A/P: Stable exam. No new or progressive interval findings to suggest recurrent or metastatic disease. The plaque-like focus of postoperative soft tissue attenuation along the anterior lower pole left kidney is unchanged.  --06/2020 CT A/P: Increasing volume of fat in the LEFT upper quadrant displacing bowel loops with subtle soft tissue without frankly nodular features but with subtle thickening of fascial planes best appreciated on coronal images, concerning for disease recurrence, up lifting pancreas and displacing bowel loops in the LEFT upper quadrant. Comparison particularly with imaging from July of 2020 shows increase in fat density in this area. --09/04/2020 MRI abd: Similar findings on MRI today as discussed on the CT abdomen/pelvis report of 06/18/2020. Fat density in  the region of liposarcoma resection appears minimally progressive since  06/16/2019. However, no obvious enhancing soft tissue nodularity or soft tissue elements within this region.  --09/20/2020 CT guided core needle  biopsy: WDLS --01/19/6432 PET: No significant metabolic activity associated with the fatty mass in the retroperitoneum ventral to the LEFT kidney. Particular attention directed to soft tissue thickening ventral LEFT kidney which was biopsied. No clear hypermetabolic activity. No evidence disease elsewhere FDG PET imaging. Small pulmonary nodule LEFT lower lobe is favored benign.  --11/17/2020 Initial consult with Dr. Donnal Moat and Dr. Kateri Mc --6/1-01/20/2021 IMRT to left abdomen (Dr. Kateri Mc) --02/02/2021 CT CAP: Increasing size of lobulated fat density in LUQ (19.1 x 10 x 21.2 cm) --03/07/2021 OR for RPS resection with en bloc partial colectomy and left nephrectomy  Past Medical History:  Diagnosis Date   Acid reflux    Anxiety    Arthritis    GERD (gastroesophageal reflux disease)    Hemorrhoids    History of kidney stones    HTN (hypertension)    Hypercholesteremia    Insomnia    Insulin resistance    Liposarcoma ([place])    in abdominal area-Removed in March 2020   Osteoarthritis of knee    Pneumonia    Type 2 HSV infection of vulvovaginal region    UTI (urinary tract infection)     Past Surgical History:  Procedure Laterality Date   APPENDECTOMY  2012   CARPAL TUNNEL RELEASE Bilateral    [place], 1996   COLONOSCOPY  11/02/2017   Dr Noberto Retort   ESOPHAGOGASTRODUODENOSCOPY  01/13/2015   Hiatal hernia. Mild gastrtiis.    REPLACEMENT TOTAL KNEE Right 03/2018   RESECTION OF RETROPERITONEAL MASS Bilateral 10/10/2018   Procedure: RESECTION OF RETROPERITONEAL MASS Take Down of Splenic Flexure;  Surgeon: [name], MD;  Location: [place];  Service: General;  Laterality: Bilateral;   RETROPERITONEAL MASS EXCISION  10/10/2018   Excision of malignant retroperitoneal mass, 23 cm in greatest dimension; takedown of splenic flexure   TONSILLECTOMY      Family History  Problem Relation Age of Onset   Breast cancer Mother    Kidney cancer Mother    Diabetes Father    Heart  disease Father    Colon cancer Neg Hx    Esophageal cancer Neg Hx     Social History   Tobacco Use   Smoking status: Never   Smokeless tobacco: Never  Vaping Use   Vaping Use: Never used  Substance Use Topics   Alcohol use: No   Drug use: No    Current Outpatient Medications  Medication Sig Dispense Refill   ALPRAZolam (XANAX) 1 MG tablet Take 0.5 mg by mouth daily as needed for anxiety.      ciprofloxacin (CIPRO) 500 MG tablet Take 500 mg by mouth 2 (two) times daily.     famotidine (PEPCID) 40 MG tablet Take one tablet by mouth at bedtime during flare-ups as directed. (Patient taking differently: Take 40 mg by mouth daily as needed. Take one tablet by mouth at bedtime during flare-ups as directed.) 30 tablet 5   metroNIDAZOLE (FLAGYL) 500 MG tablet 1 tablet BID     mirtazapine (REMERON) 30 MG tablet Take 30 mg by mouth at bedtime.     Multiple Vitamin (MULTIVITAMIN WITH MINERALS) TABS tablet Take 1 tablet by mouth daily.     pantoprazole (PROTONIX) 40 MG tablet TAKE 1 TABLET TWICE A DAY 60 tablet 5   zolpidem (AMBIEN CR) 12.5 MG  CR tablet Take 12.5 mg by mouth at bedtime as needed for sleep.      No current facility-administered medications for this visit.    Allergies  Allergen Reactions   Bupropion Other (See Comments)    "cried very easily"   Sulfa Antibiotics     UNSPECIFIED REACTION    Sulfamethoxazole Rash   Topiramate Other (See Comments)    "mental fogginess"    Review of Systems:  Constitutional: Denies fever, chills, diaphoresis, appetite change and fatigue.  HEENT: Denies photophobia, eye pain, redness, hearing loss, ear pain, congestion, sore throat, rhinorrhea, sneezing, mouth sores, neck pain, neck stiffness and tinnitus.   Respiratory: Denies SOB, DOE, cough, chest tightness,  and wheezing.   Cardiovascular: Denies chest pain, palpitations and leg swelling.  Genitourinary: Denies dysuria, urgency, frequency, hematuria, flank pain and difficulty  urinating.  Musculoskeletal: Denies myalgias, back pain, joint swelling, arthralgias and gait problem.  Skin: No rash.  Neurological: Denies dizziness, seizures, syncope, weakness, light-headedness, numbness and headaches.  Hematological: Denies adenopathy. Easy bruising, personal or family bleeding history  Psychiatric/Behavioral: No anxiety or depression     Physical Exam:    BP 128/86   Pulse 90   Ht 5\' 3"  (1.6 m)   Wt 185 lb (83.9 kg)   SpO2 98%   BMI 32.77 kg/m  Filed Weights   05/23/21 1342  Weight: 185 lb (83.9 kg)   Constitutional:  Well-developed, in no acute distress. Psychiatric: Normal mood and affect. Behavior is normal. HEENT: Pupils normal.  Conjunctivae are normal. No scleral icterus.  Positive for pallor. Cardiovascular: Normal rate, regular rhythm. No edema Pulmonary/chest: Effort normal and breath sounds normal. No wheezing, rales or rhonchi. Abdominal: Soft, nondistended. Nontender. Bowel sounds active throughout. There are no masses palpable. No hepatomegaly.  Has well-healed surgical scar.  Has developing keloid. Rectal:  defered Neurological: Alert and oriented to person place and time. Skin: Skin is warm and dry. No rashes noted.  Data Reviewed: I have personally reviewed following labs and imaging studies    Carmell Austria, MD 05/23/2021, 1:54 PM  Cc: Ernestene Kiel, MD

## 2021-06-02 ENCOUNTER — Telehealth: Payer: Self-pay

## 2021-06-02 NOTE — Telephone Encounter (Signed)
Pt notified that she should continue to take the Iron Supplements and yes this does take some time to see the change in her body. Pt questioning if she could take B12. Pt informed that taking B12 is fine. Pt verbalized understanding with all questions answered.

## 2021-12-04 IMAGING — CT CT ABD-PELV W/ CM
2 of 5 series · 15 of 46 positions shown, 17 images · IV contrast (iopamidol)
Comparison: 11/12/2019

CLINICAL DATA: History of liposarcoma with excision and post
appendectomy follow-up evaluation in this 54-year-old female.

EXAM:
CT ABDOMEN AND PELVIS WITH CONTRAST
TECHNIQUE: Multidetector CT imaging of the abdomen and pelvis was performed
using the standard protocol following bolus administration of
intravenous contrast.
CONTRAST:  100mL ZP3D6P-7EE IOPAMIDOL (ZP3D6P-7EE) INJECTION 61%

[Series 2: abd pelvis 5.00 br40 s3 axial · axial · 0.82mm/px · z∈[+1160,+1570]mm · 12 of 94 slices shown, 14 images]
[im 6/94  soft-tissue]
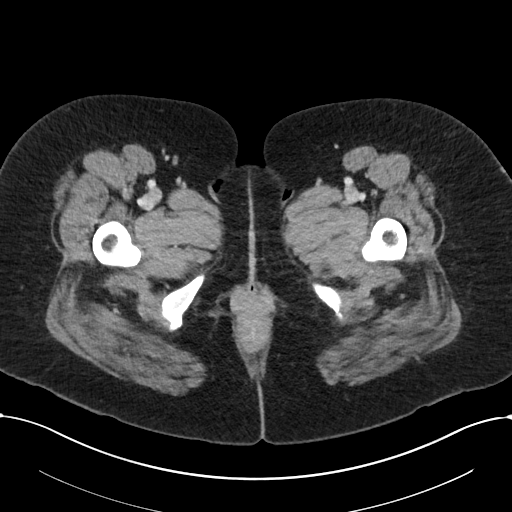
[im 6/94  bone]
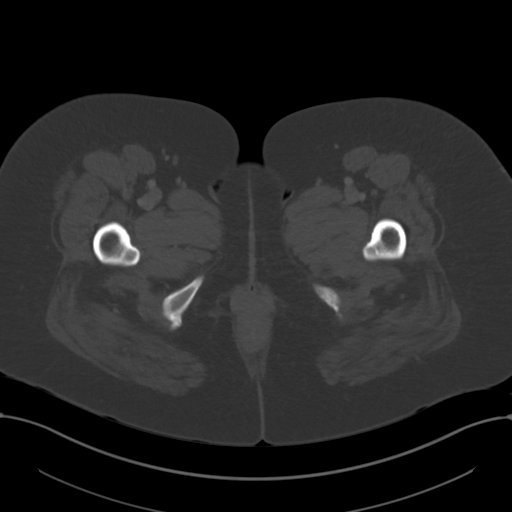
[im 12/94  soft-tissue]
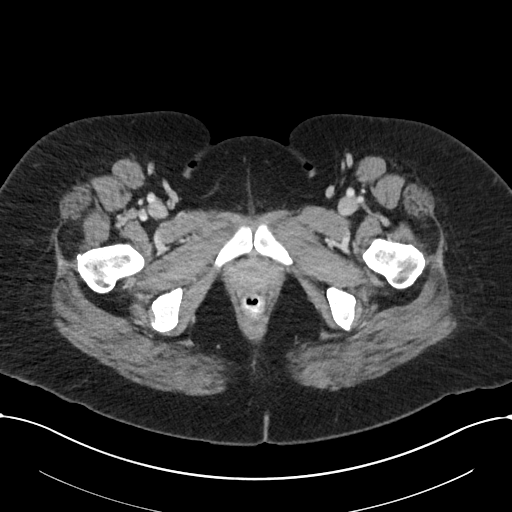
[im 24/94  soft-tissue]
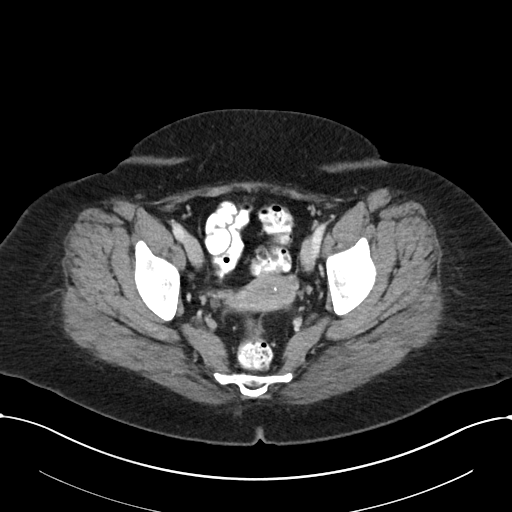
[im 30/94  soft-tissue]
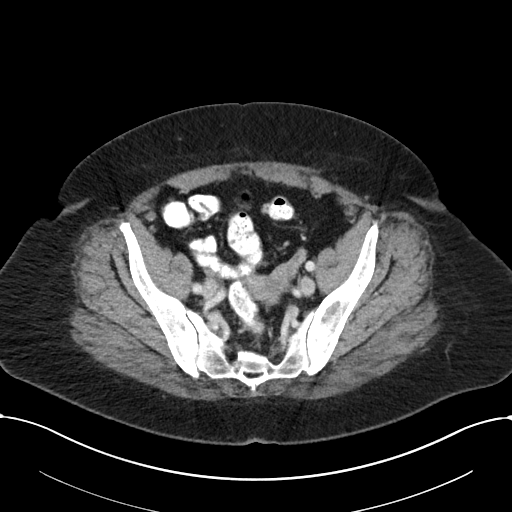
[im 35/94  soft-tissue]
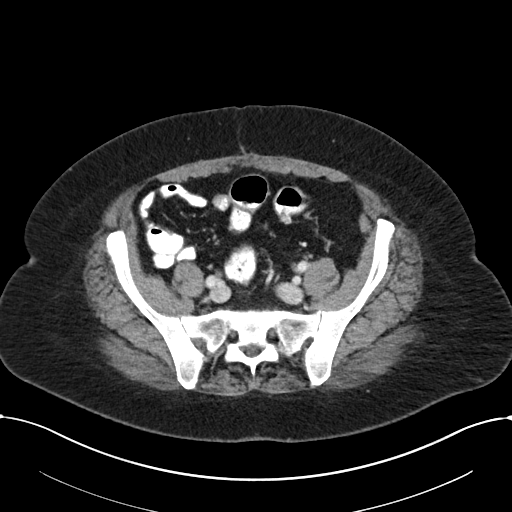
[im 41/94  soft-tissue]
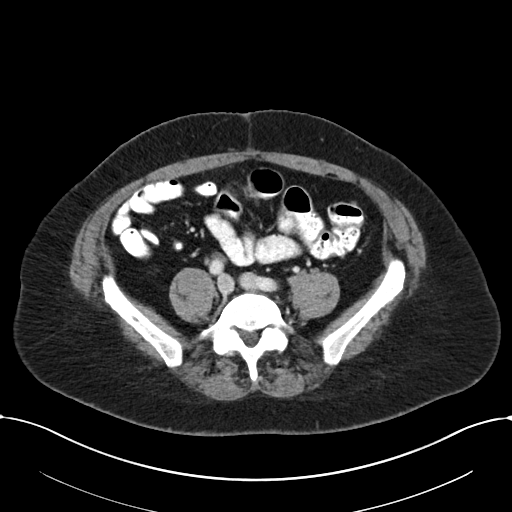
[im 53/94  soft-tissue]
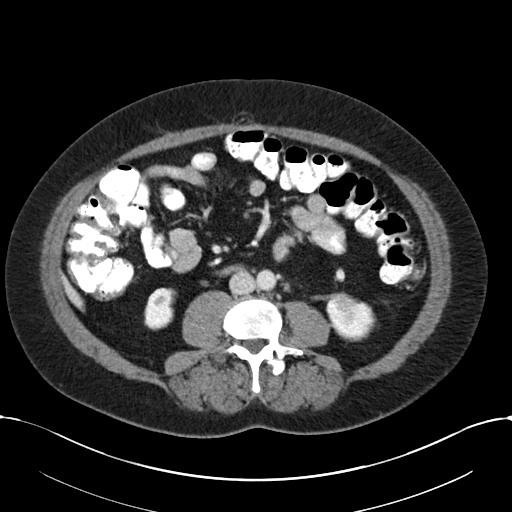
[im 59/94  soft-tissue]
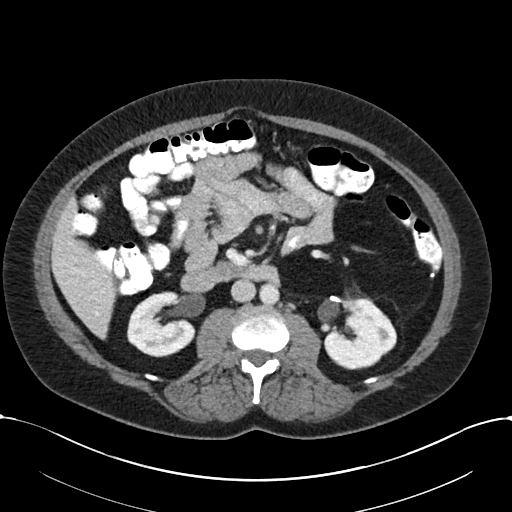
[im 64/94  soft-tissue]
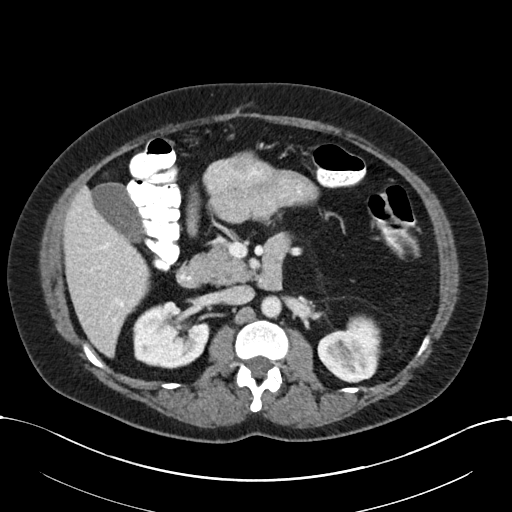
[im 64/94  bone]
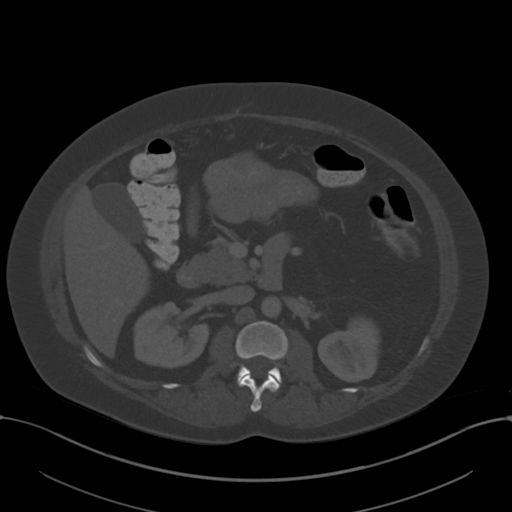
[im 70/94  soft-tissue]
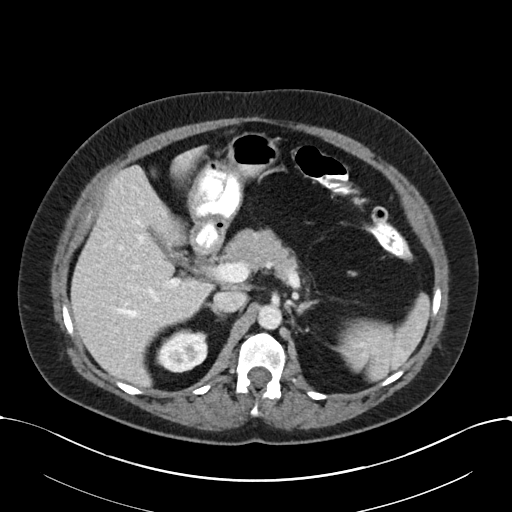
[im 82/94  soft-tissue]
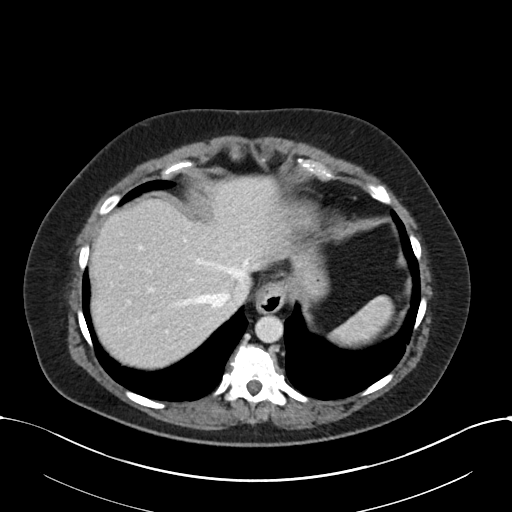
[im 88/94  soft-tissue]
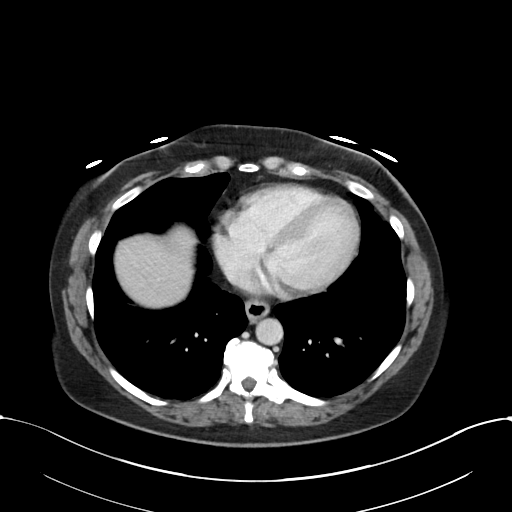

[Series 6: abd pelvis 2.00 br40 s3 cor · coronal · 0.82mm/px · 3 of 171 slices shown]
[im 57/171  soft-tissue]
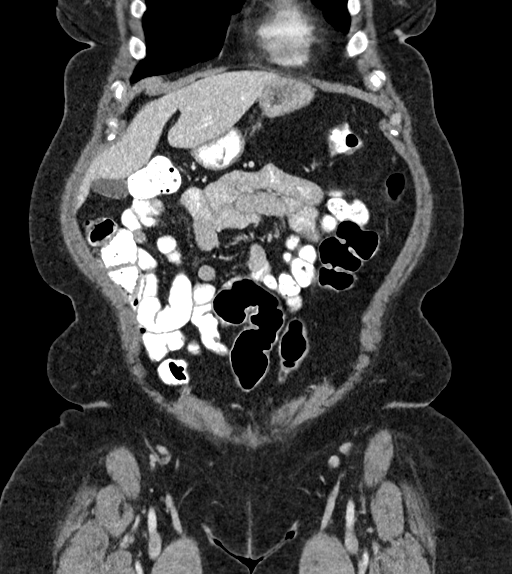
[im 76/171  soft-tissue]
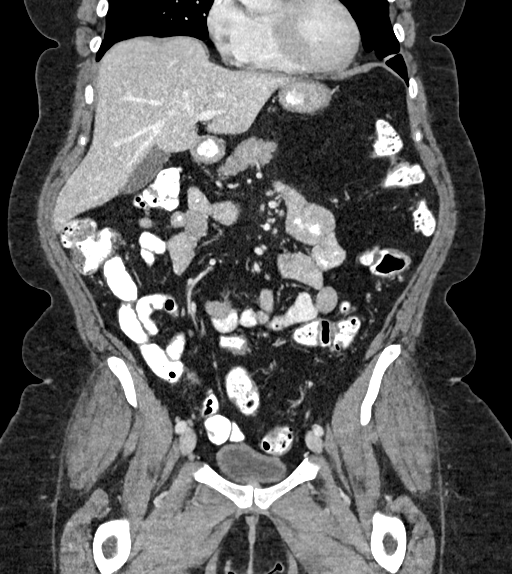
[im 95/171  soft-tissue]
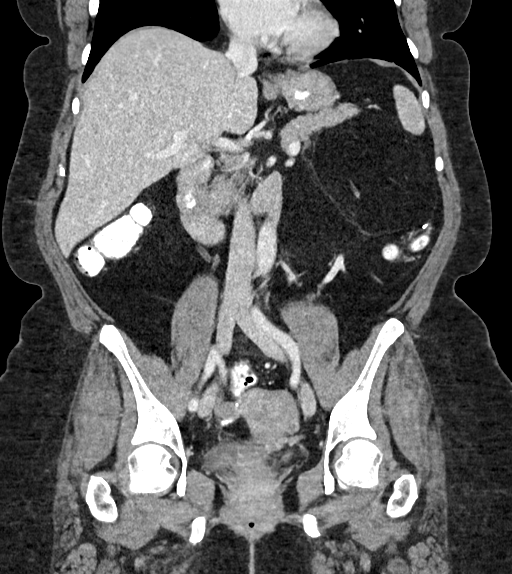

[15 of 46 positions shown; findings below may reference images not displayed]

FINDINGS: Lower chest: Basilar atelectasis. No consolidation. No pleural
effusion.

Hepatobiliary: No focal, suspicious hepatic lesion. Portal vein is
patent. Hepatic veins are patent. No pericholecystic stranding or
gallbladder distension. No biliary duct dilation.

Pancreas: Normal appearance of the pancreas without ductal dilation
or inflammation.

Spleen: Normal appearance of the spleen.

Adrenals/Urinary Tract: Adrenal glands are normal.

Renal cortical scarring about the upper pole the RIGHT kidney is
similar to the prior study. Mild scarring associated with the
anterior LEFT kidney with postoperative changes of LEFT
retroperitoneal resection showing a similar appearance. Fat density
without signs of soft tissue interdigitate between colon and
anterior LEFT kidney, a margin of the spleen and pancreas without
change this area measuring 8.9 x 5.7 cm is stable. Stranding along
the anterior margin of the LEFT kidney is also a stable finding.
Otherwise with symmetric renal enhancement. No sign of
hydronephrosis.

Stomach/Bowel: Small hiatal hernia. Similar displacement of bowel
loops in the LEFT upper quadrant following retroperitoneal mass
resection. This has however shown slow increase over time, as
compared to February 13, 2019 there is further displacement of bowel
loops in the LEFT upper quadrant. There is also in addition of
plaque-like thickening of soft tissue along the anterior margin of
the LEFT kidney subtle anterior extension of soft tissue best seen
on image 38 of series 2 which again is stable since the prior exam
though not definitely seen on the exam of Saturday May, 2019.

No acute small bowel process no acute colonic finding. Scattered
colonic diverticulosis of the sigmoid colon.

Vascular/Lymphatic: Patent abdominal vasculature. There is no
gastrohepatic or hepatoduodenal ligament lymphadenopathy. No
retroperitoneal or mesenteric lymphadenopathy.

Reproductive: Normal appearance of uterus and adnexa. No pelvic
sidewall lymphadenopathy.

Other: Postoperative changes in the midline.  No ascites.

Musculoskeletal: No acute musculoskeletal process. Spinal
degenerative changes.
IMPRESSION: 1. Postoperative changes of LEFT retroperitoneal resection.
2. Increasing volume of fat in the LEFT upper quadrant displacing
bowel loops with subtle soft tissue without frankly nodular features
but with subtle thickening of fascial planes best appreciated on
coronal images, concerning for disease recurrence, up lifting
pancreas and displacing bowel loops in the LEFT upper quadrant.
Comparison particularly with imaging from Tuesday January, 2019 shows
increase in fat density in this area. Perhaps PET scan could be
helpful given the subtle soft tissue along the anterior margin of
the kidney that is contiguous with fascial thickening. Alternatively
short interval follow-up could be performed to assess for further
change.
3. No acute intra-abdominal process.
4. Small hiatal hernia.

## 2022-01-26 ENCOUNTER — Other Ambulatory Visit: Payer: Self-pay | Admitting: Internal Medicine

## 2022-01-26 DIAGNOSIS — R748 Abnormal levels of other serum enzymes: Secondary | ICD-10-CM

## 2022-01-27 ENCOUNTER — Ambulatory Visit
Admission: RE | Admit: 2022-01-27 | Discharge: 2022-01-27 | Disposition: A | Discharge status: Home / Self Care | Payer: Managed Care, Other (non HMO) | Source: Ambulatory Visit | Attending: Internal Medicine | Admitting: Internal Medicine

## 2022-01-27 DIAGNOSIS — R748 Abnormal levels of other serum enzymes: Secondary | ICD-10-CM

## 2022-03-08 IMAGING — CT CT BIOPSY
1 of 3 series · 13 of 32 positions shown, 19 images · non-contrast
Comparison: none

INDICATION: Retroperitoneal enlarging fatty mass, history low-grade liposarcoma

[Series 2: i-spiral 5.0 b40f · axial · 0.95mm/px · z∈[+828,+1168]mm · 13 of 115 slices shown, 19 images]
[im 9/115  soft-tissue]
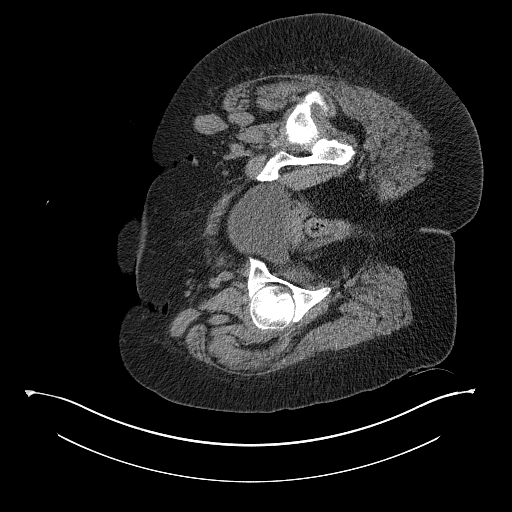
[im 9/115  bone]
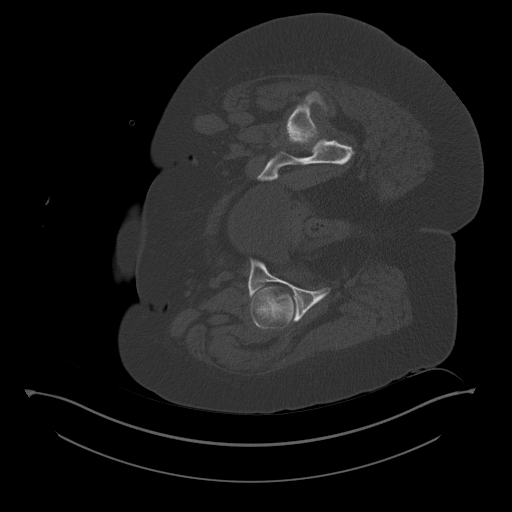
[im 17/115  soft-tissue]
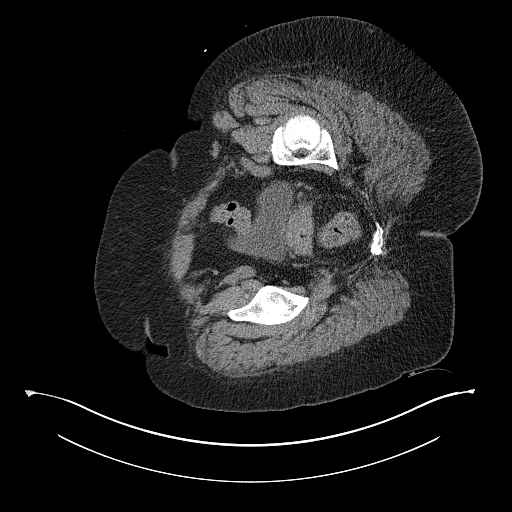
[im 25/115  soft-tissue]
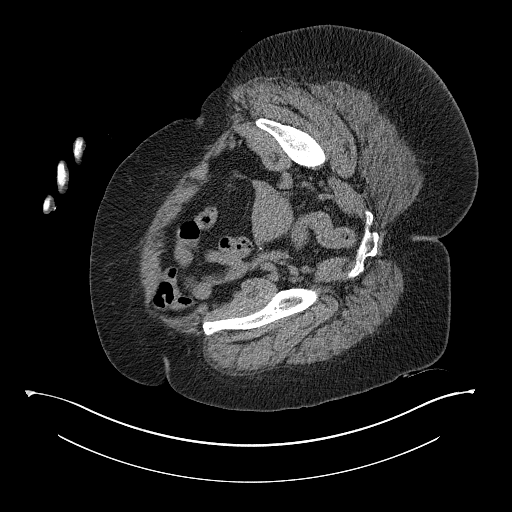
[im 33/115  soft-tissue]
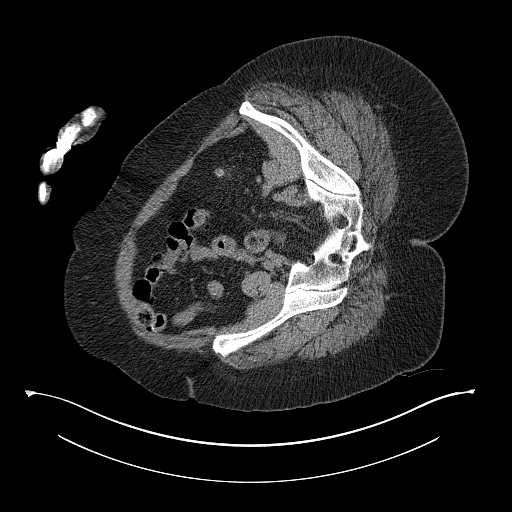
[im 41/115  soft-tissue]
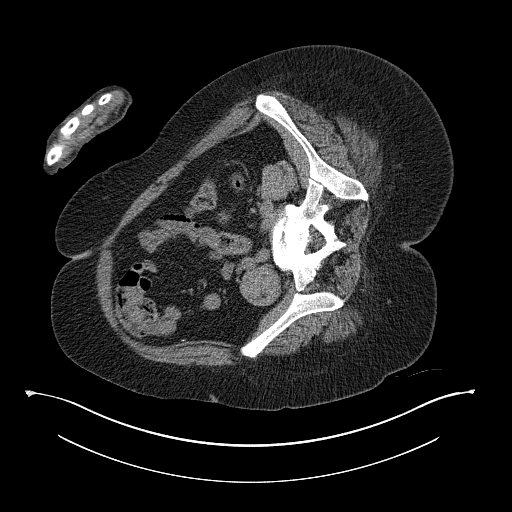
[im 49/115  soft-tissue]
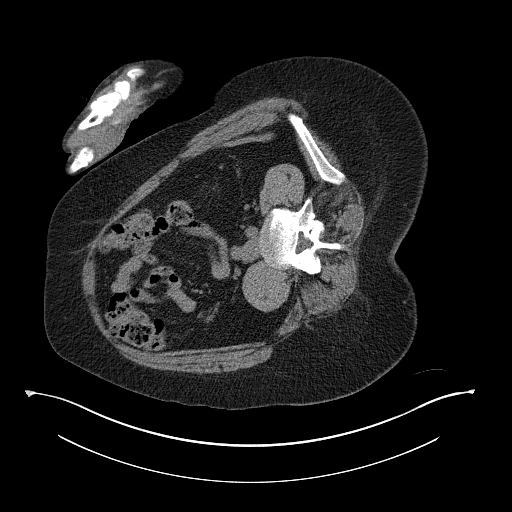
[im 58/115  soft-tissue]
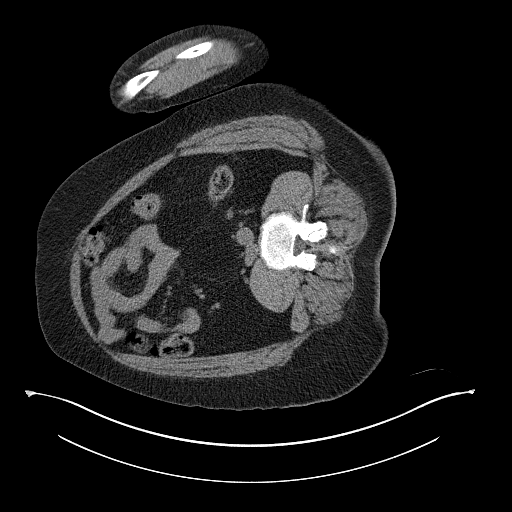
[im 66/115  soft-tissue]
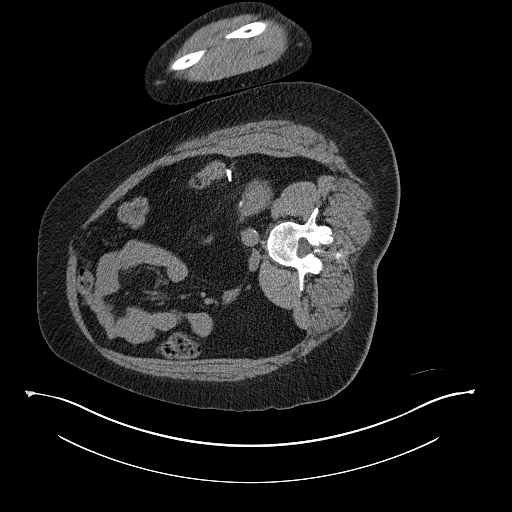
[im 74/115  soft-tissue]
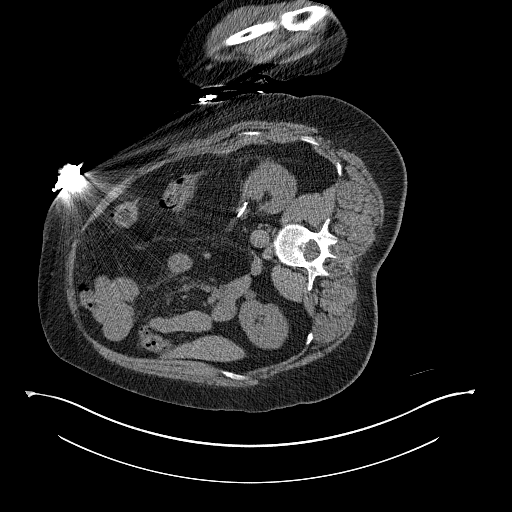
[im 74/115  bone]
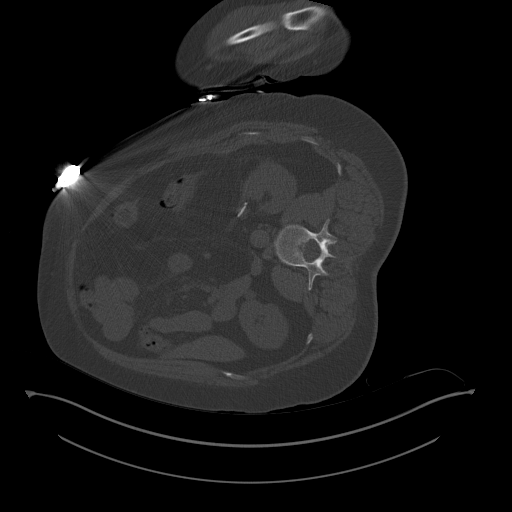
[im 82/115  soft-tissue]
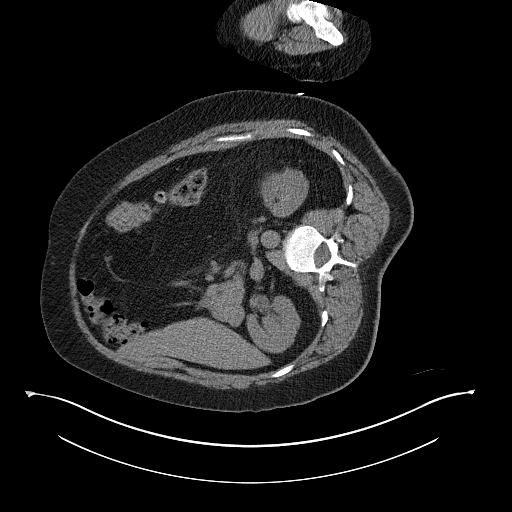
[im 82/115  lung]
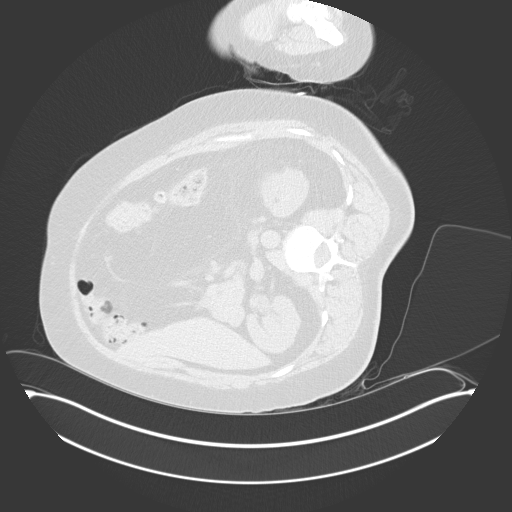
[im 90/115  soft-tissue]
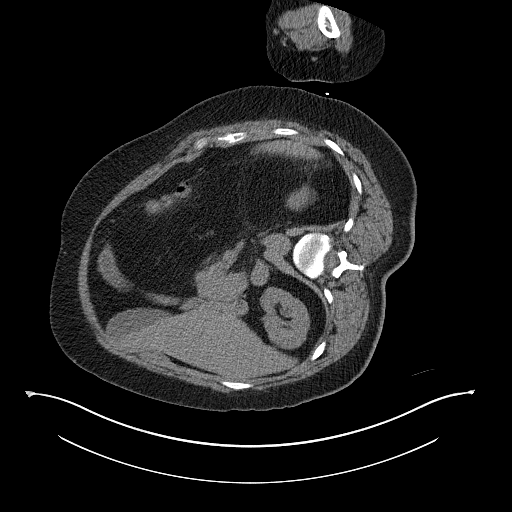
[im 90/115  lung]
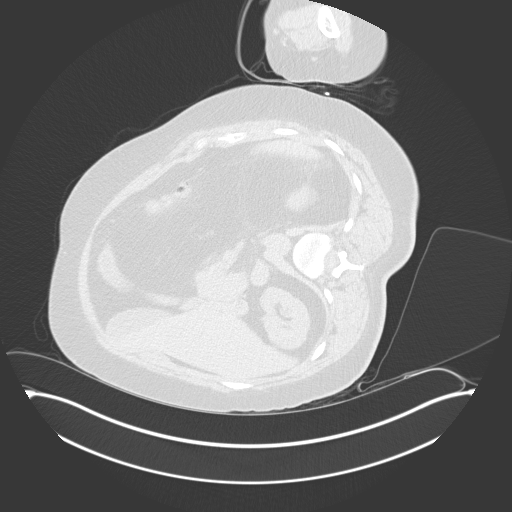
[im 98/115  soft-tissue]
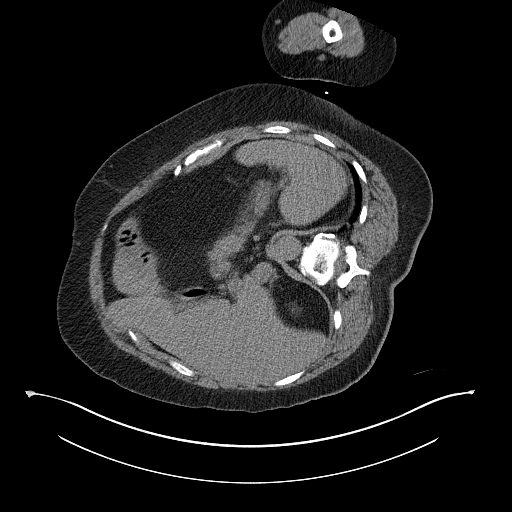
[im 98/115  lung]
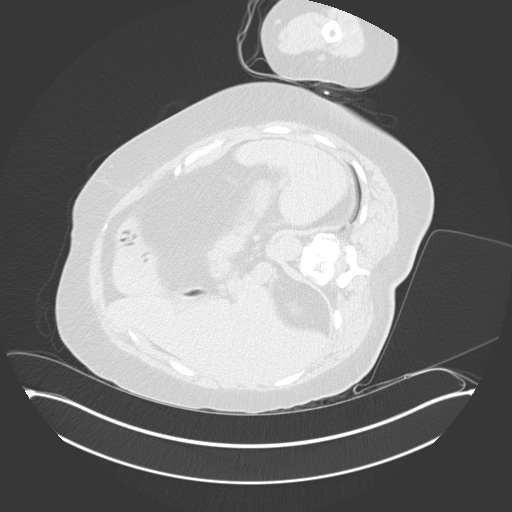
[im 106/115  soft-tissue]
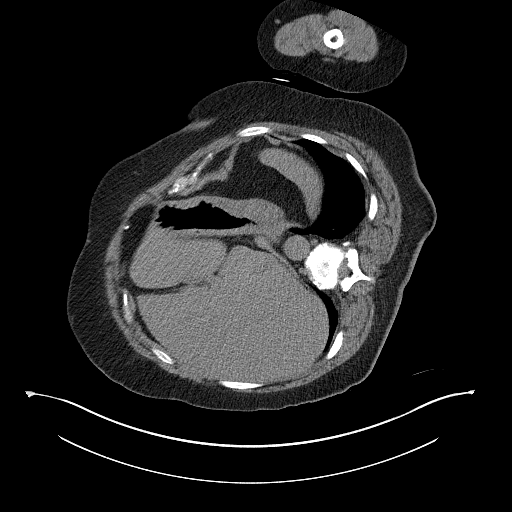
[im 106/115  lung]
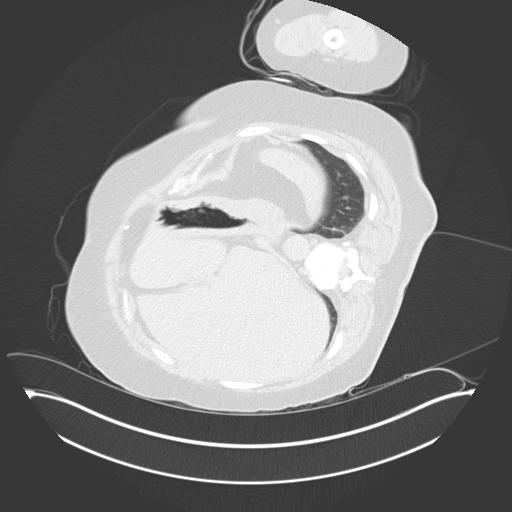

[13 of 32 positions shown; findings below may reference images not displayed]

EXAM:
CT-GUIDED BIOPSY LEFT ABDOMINAL RETROPERITONEAL FATTY MASS

MEDICATIONS:
1% lidocaine local

ANESTHESIA/SEDATION:
3.0 mg IV Versed; 100 mcg IV Fentanyl

Moderate Sedation Time:  12 minutes

The patient was continuously monitored during the procedure by the
interventional radiology nurse under my direct supervision.

PROCEDURE:
The procedure, risks, benefits, and alternatives were explained to
the patient. Questions regarding the procedure were encouraged and
answered. The patient understands and consents to the procedure.

Previous imaging reviewed. Patient positioned left anterior oblique.
Noncontrast localization CT performed. The retroperitoneal fatty
mass anterior to the left kidney was localized and marked for a
lateral approach.

Under sterile conditions and local anesthesia, a 17 gauge coaxial
guide was advanced to the fatty lesion. Needle position confirmed
with CT. This was correlated with the prior CT and MRI. Core
biopsies obtained. Sampling was strandy fat fragments placed in
formalin. Needle removed. Postprocedure imaging demonstrates no
hemorrhage or hematoma.

Patient tolerated the procedure well without complication. Vital
sign monitoring by nursing staff during the procedure will continue
as patient is in the special procedures unit for post procedure
observation.
FINDINGS: The images document guide needle placement within the left
retroperitoneal fatty mass. Post biopsy images demonstrate no
hemorrhage or hematoma.

COMPLICATIONS:
None immediate.
IMPRESSION: Successful CT-guided core biopsy of the left retroperitoneal fatty
mass

## 2022-03-13 ENCOUNTER — Ambulatory Visit: Payer: Managed Care, Other (non HMO) | Admitting: Gastroenterology

## 2022-03-29 ENCOUNTER — Other Ambulatory Visit: Payer: Self-pay | Admitting: Internal Medicine

## 2022-03-29 DIAGNOSIS — Z1231 Encounter for screening mammogram for malignant neoplasm of breast: Secondary | ICD-10-CM

## 2022-05-01 ENCOUNTER — Ambulatory Visit: Payer: Managed Care, Other (non HMO)

## 2022-05-24 ENCOUNTER — Ambulatory Visit: Payer: Managed Care, Other (non HMO) | Admitting: Gastroenterology

## 2022-05-24 ENCOUNTER — Encounter: Payer: Self-pay | Admitting: Gastroenterology

## 2022-05-24 VITALS — BP 124/100 | HR 96 | Ht 62.0 in | Wt 199.1 lb

## 2022-05-24 DIAGNOSIS — K219 Gastro-esophageal reflux disease without esophagitis: Secondary | ICD-10-CM

## 2022-05-24 DIAGNOSIS — R1310 Dysphagia, unspecified: Secondary | ICD-10-CM | POA: Diagnosis not present

## 2022-05-24 DIAGNOSIS — K449 Diaphragmatic hernia without obstruction or gangrene: Secondary | ICD-10-CM | POA: Diagnosis not present

## 2022-05-24 NOTE — Patient Instructions (Addendum)
If you are age 55 or older, your body mass index should be between 23-30. Your Body mass index is 36.42 kg/m. If this is out of the aforementioned range listed, please consider follow up with your Primary Care Provider.  If you are age 65 or younger, your body mass index should be between 19-25. Your Body mass index is 36.42 kg/m. If this is out of the aformentioned range listed, please consider follow up with your Primary Care Provider.   ________________________________________________________  Brittany Lee have been scheduled for an endoscopy. Please follow written instructions given to you at your visit today. If you use inhalers (even only as needed), please bring them with you on the day of your procedure.  Resume Protonix 40 mg once daily   It was a pleasure to see you today!  Thank you for trusting me with your gastrointestinal care!

## 2022-05-24 NOTE — Progress Notes (Signed)
Chief Complaint: FU  Referring Provider:  Ernestene Kiel, MD      ASSESSMENT AND PLAN;   #1. GERD with HH, now with dysphagia  #2. 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 No recurrence on recent CT chest/A/P 02/2022.  Being followed at [facility].   Plan: -EGD with dil -Resume protonix '40mg'$  po QD #90, 4 RF   HPI:    Brittany Lee is a 56 y.o. female  With well-differentiated liposarcoma s/p resection x 2 with left partial colectomy, left nephrectomy (most recently 02/2021). Neg CT C/A/P 02/2022 for recurrrence.  C/O solid food dysphagia mostly in the lower chest area.  He has stopped taking Protonix.  Also has been having heartburn with some regurgitation.  No odynophagia.  No nausea/vomiting.  She is having normal BMs.  No diarrhea or constipation.  No melena or hematochezia.  Previous dilatation in 2013 did help significantly.   Past GI procedures: -EGD 01/13/2015 3 cm hiatal hernia, mild gastritis. EGD with dil 11/2011-transient Schatzki's ring s/p dil -Colon 10/2017- neg (Dr Noberto Retort) rpt 10 yrs.  CT AP 04/13/2021 1.  Interval resection of left retroperitoneal sarcoma, left nephrectomy,  left adrenalectomy, and partial colectomy. Small volume free fluid in the  left upper quadrant and pelvis is likely postsurgical.  2.  Mild wall thickening of the transverse and proximal descending colon  which may also be reactive versus a mild nonspecific colitis.  3.  Peripheral wedge-shaped area of hypoenhancement in the spleen which may  represent a splenic infarct versus postsurgical change.  4.  Moderate left pleural effusion. CT AP 04/2021 1. Left-sided colonic inflammatory or infectious process. 2. Small amount of free abdominal and free pelvic fluid without definite peritoneal enhancement to suggest abscess or peritonitis. 3. Status post left nephrectomy. 4. Small focal  triangular area of decreased perfusion involving the anterior lower aspect of the spleen most likely a small splenic infarct.   CT  02/2022 Chest/A/P 1.  Fluid density rounded focus in the anterior abdomen measuring up to 1.9  cm, previously subcentimeter. Findings are indeterminate, possibly evolving  post-surgical change, however other post-surgical changes have improved  compared to prior exam. Attention on short-term follow-up CT.  2.  Mild haziness in the fat of the right anterior mesentery, favored  post-operative change. Continued attention on follow-up    Oncology history WDLS of the retroperitoneum: --09/2018 presented with abdominal distention and early satiety x 3 months --09/2018 MRI reportedly showed 23 x 14 x 21 cm left RP mass c/f RP liposarcoma --09/2018 OR (Dr. [name]): excision of RP mass, 23 cm. Clear margins. --10/2019 CT A/P: Stable exam. No new or progressive interval findings to suggest recurrent or metastatic disease. The plaque-like focus of postoperative soft tissue attenuation along the anterior lower pole left kidney is unchanged.  --06/2020 CT A/P: Increasing volume of fat in the LEFT upper quadrant displacing bowel loops with subtle soft tissue without frankly nodular features but with subtle thickening of fascial planes best appreciated on coronal images, concerning for disease recurrence, up lifting pancreas and displacing bowel loops in the LEFT upper quadrant. Comparison particularly with imaging from July of 2020 shows increase in fat density in this area. --09/04/2020 MRI abd: Similar findings on MRI today as discussed on the CT abdomen/pelvis report of 06/18/2020. Fat density in the region of liposarcoma resection appears minimally progressive since  06/16/2019. However, no obvious enhancing soft  tissue nodularity or soft tissue elements within this region.  --09/20/2020 CT guided core needle biopsy: WDLS --11/16/8411 PET: No significant metabolic activity  associated with the fatty mass in the retroperitoneum ventral to the LEFT kidney. Particular attention directed to soft tissue thickening ventral LEFT kidney which was biopsied. No clear hypermetabolic activity. No evidence disease elsewhere FDG PET imaging. Small pulmonary nodule LEFT lower lobe is favored benign.  --11/17/2020 Initial consult with Dr. Donnal Moat and Dr. Kateri Mc --6/1-01/20/2021 IMRT to left abdomen (Dr. Kateri Mc) --02/02/2021 CT CAP: Increasing size of lobulated fat density in LUQ (19.1 x 10 x 21.2 cm) --03/07/2021 OR for RPS resection with en bloc partial colectomy and left nephrectomy  Past Medical History:  Diagnosis Date   Acid reflux    Anxiety    Arthritis    GERD (gastroesophageal reflux disease)    Hemorrhoids    History of kidney stones    HTN (hypertension)    Hypercholesteremia    Insomnia    Insulin resistance    Liposarcoma ([place])    in abdominal area-Removed in March 2020   Osteoarthritis of knee    Pneumonia    Type 2 HSV infection of vulvovaginal region    UTI (urinary tract infection)     Past Surgical History:  Procedure Laterality Date   APPENDECTOMY  2012   CARPAL TUNNEL RELEASE Bilateral    [place], 1996   COLONOSCOPY  11/02/2017   Dr Noberto Retort   ESOPHAGOGASTRODUODENOSCOPY  01/13/2015   Hiatal hernia. Mild gastrtiis.    REPLACEMENT TOTAL KNEE Right 03/2018   RESECTION OF RETROPERITONEAL MASS Bilateral 10/10/2018   Procedure: RESECTION OF RETROPERITONEAL MASS Take Down of Splenic Flexure;  Surgeon: [name], MD;  Location: [place];  Service: General;  Laterality: Bilateral;   RETROPERITONEAL MASS EXCISION  10/10/2018   Excision of malignant retroperitoneal mass, 23 cm in greatest dimension; takedown of splenic flexure   TONSILLECTOMY      Family History  Problem Relation Age of Onset   Breast cancer Mother    Kidney cancer Mother    Diabetes Father    Heart disease Father    Colon cancer Neg Hx    Esophageal cancer Neg Hx      Social History   Tobacco Use   Smoking status: Never   Smokeless tobacco: Never  Vaping Use   Vaping Use: Never used  Substance Use Topics   Alcohol use: No   Drug use: No    Current Outpatient Medications  Medication Sig Dispense Refill   acetaminophen (TYLENOL) 325 MG tablet Take 650 mg by mouth as needed.     ALPRAZolam (XANAX) 1 MG tablet Take 0.5 mg by mouth daily as needed for anxiety.      famotidine (PEPCID) 40 MG tablet Take one tablet by mouth at bedtime during flare-ups as directed. (Patient taking differently: Take 40 mg by mouth daily as needed. Take one tablet by mouth at bedtime during flare-ups as directed.) 30 tablet 5   mirtazapine (REMERON) 30 MG tablet Take 30 mg by mouth at bedtime.     MOUNJARO 12.5 MG/0.5ML Pen Inject 12.5 mg into the skin once a week.     Multiple Vitamin (MULTIVITAMIN WITH MINERALS) TABS tablet Take 1 tablet by mouth daily.     ondansetron (ZOFRAN) 4 MG tablet Take 4 mg by mouth as needed.     pantoprazole (PROTONIX) 40 MG tablet TAKE 1 TABLET TWICE A DAY 60 tablet 5   rosuvastatin (CRESTOR)  5 MG tablet Take 5 mg by mouth 2 (two) times a week.     No current facility-administered medications for this visit.    Allergies  Allergen Reactions   Bupropion Other (See Comments)    "cried very easily"   Buspar [Buspirone]    Sulfa Antibiotics     UNSPECIFIED REACTION    Sulfamethoxazole Rash   Topamax [Topiramate] Other (See Comments)    "mental fogginess"    Review of Systems:  neg     Physical Exam:    BP (!) 124/100 (BP Location: Left Arm, Patient Position: Sitting, Cuff Size: Normal)   Pulse 96   Ht '5\' 2"'$  (1.575 m)   Wt 199 lb 2 oz (90.3 kg)   BMI 36.42 kg/m  Filed Weights   05/24/22 0932  Weight: 199 lb 2 oz (90.3 kg)   Constitutional:  Well-developed, in no acute distress. Psychiatric: Normal mood and affect. Behavior is normal. HEENT: Pupils normal.  Conjunctivae are normal. No scleral icterus.  Positive for  pallor. Cardiovascular: Normal rate, regular rhythm. No edema Pulmonary/chest: Effort normal and breath sounds normal. No wheezing, rales or rhonchi. Abdominal: Soft, nondistended. Nontender. Bowel sounds active throughout. There are no masses palpable. No hepatomegaly.  Has well-healed surgical scar.  Has developing keloid. Rectal:  defered Neurological: Alert and oriented to person place and time. Skin: Skin is warm and dry. No rashes noted.   Carmell Austria, MD 05/24/2022, 9:49 AM  Cc: Ernestene Kiel, MD

## 2022-05-25 ENCOUNTER — Ambulatory Visit: Payer: Managed Care, Other (non HMO) | Admitting: Gastroenterology

## 2022-05-25 ENCOUNTER — Encounter: Payer: Self-pay | Admitting: Gastroenterology

## 2022-05-25 VITALS — BP 122/79 | HR 86 | Temp 97.7°F | Resp 15 | Ht 62.0 in | Wt 199.0 lb

## 2022-05-25 DIAGNOSIS — K219 Gastro-esophageal reflux disease without esophagitis: Secondary | ICD-10-CM

## 2022-05-25 DIAGNOSIS — K2950 Unspecified chronic gastritis without bleeding: Secondary | ICD-10-CM

## 2022-05-25 DIAGNOSIS — K449 Diaphragmatic hernia without obstruction or gangrene: Secondary | ICD-10-CM

## 2022-05-25 DIAGNOSIS — K222 Esophageal obstruction: Secondary | ICD-10-CM | POA: Diagnosis not present

## 2022-05-25 DIAGNOSIS — R1310 Dysphagia, unspecified: Secondary | ICD-10-CM | POA: Diagnosis not present

## 2022-05-25 DIAGNOSIS — K229 Disease of esophagus, unspecified: Secondary | ICD-10-CM | POA: Diagnosis not present

## 2022-05-25 MED ORDER — SODIUM CHLORIDE 0.9 % IV SOLN: 500.0000 mL | INTRAVENOUS | Status: DC

## 2022-05-25 NOTE — Progress Notes (Signed)
Chief Complaint: FU  Referring Provider:  Ernestene Kiel, MD      ASSESSMENT AND PLAN;   #1. GERD with HH, now with dysphagia  #2. 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 No recurrence on recent CT chest/A/P 02/2022.  Being followed at [facility].   Plan: -EGD with dil -Resume protonix '40mg'$  po QD #90, 4 RF   HPI:    Brittany Lee is a 56 y.o. female  With well-differentiated liposarcoma s/p resection x 2 with left partial colectomy, left nephrectomy (most recently 02/2021). Neg CT C/A/P 02/2022 for recurrrence.  C/O solid food dysphagia mostly in the lower chest area.  He has stopped taking Protonix.  Also has been having heartburn with some regurgitation.  No odynophagia.  No nausea/vomiting.  She is having normal BMs.  No diarrhea or constipation.  No melena or hematochezia.  Previous dilatation in 2013 did help significantly.   Past GI procedures: -EGD 01/13/2015 3 cm hiatal hernia, mild gastritis. EGD with dil 11/2011-transient Schatzki's ring s/p dil -Colon 10/2017- neg (Dr Noberto Retort) rpt 10 yrs.  CT AP 04/13/2021 1.  Interval resection of left retroperitoneal sarcoma, left nephrectomy,  left adrenalectomy, and partial colectomy. Small volume free fluid in the  left upper quadrant and pelvis is likely postsurgical.  2.  Mild wall thickening of the transverse and proximal descending colon  which may also be reactive versus a mild nonspecific colitis.  3.  Peripheral wedge-shaped area of hypoenhancement in the spleen which may  represent a splenic infarct versus postsurgical change.  4.  Moderate left pleural effusion. CT AP 04/2021 1. Left-sided colonic inflammatory or infectious process. 2. Small amount of free abdominal and free pelvic fluid without definite peritoneal enhancement to suggest abscess or peritonitis. 3. Status post left nephrectomy. 4. Small focal  triangular area of decreased perfusion involving the anterior lower aspect of the spleen most likely a small splenic infarct.   CT  02/2022 Chest/A/P 1.  Fluid density rounded focus in the anterior abdomen measuring up to 1.9  cm, previously subcentimeter. Findings are indeterminate, possibly evolving  post-surgical change, however other post-surgical changes have improved  compared to prior exam. Attention on short-term follow-up CT.  2.  Mild haziness in the fat of the right anterior mesentery, favored  post-operative change. Continued attention on follow-up    Oncology history WDLS of the retroperitoneum: --09/2018 presented with abdominal distention and early satiety x 3 months --09/2018 MRI reportedly showed 23 x 14 x 21 cm left RP mass c/f RP liposarcoma --09/2018 OR (Dr. [name]): excision of RP mass, 23 cm. Clear margins. --10/2019 CT A/P: Stable exam. No new or progressive interval findings to suggest recurrent or metastatic disease. The plaque-like focus of postoperative soft tissue attenuation along the anterior lower pole left kidney is unchanged.  --06/2020 CT A/P: Increasing volume of fat in the LEFT upper quadrant displacing bowel loops with subtle soft tissue without frankly nodular features but with subtle thickening of fascial planes best appreciated on coronal images, concerning for disease recurrence, up lifting pancreas and displacing bowel loops in the LEFT upper quadrant. Comparison particularly with imaging from July of 2020 shows increase in fat density in this area. --09/04/2020 MRI abd: Similar findings on MRI today as discussed on the CT abdomen/pelvis report of 06/18/2020. Fat density in the region of liposarcoma resection appears minimally progressive since  06/16/2019. However, no obvious enhancing soft  tissue nodularity or soft tissue elements within this region.  --09/20/2020 CT guided core needle biopsy: WDLS --07/22/1094 PET: No significant metabolic activity  associated with the fatty mass in the retroperitoneum ventral to the LEFT kidney. Particular attention directed to soft tissue thickening ventral LEFT kidney which was biopsied. No clear hypermetabolic activity. No evidence disease elsewhere FDG PET imaging. Small pulmonary nodule LEFT lower lobe is favored benign.  --11/17/2020 Initial consult with Dr. Donnal Moat and Dr. Kateri Mc --6/1-01/20/2021 IMRT to left abdomen (Dr. Kateri Mc) --02/02/2021 CT CAP: Increasing size of lobulated fat density in LUQ (19.1 x 10 x 21.2 cm) --03/07/2021 OR for RPS resection with en bloc partial colectomy and left nephrectomy  Past Medical History:  Diagnosis Date   Acid reflux    Anxiety    Arthritis    GERD (gastroesophageal reflux disease)    Hemorrhoids    History of kidney stones    HTN (hypertension)    Hypercholesteremia    Insomnia    Insulin resistance    Liposarcoma ([place])    in abdominal area-Removed in March 2020   Osteoarthritis of knee    Pneumonia    Type 2 HSV infection of vulvovaginal region    UTI (urinary tract infection)     Past Surgical History:  Procedure Laterality Date   APPENDECTOMY  2012   CARPAL TUNNEL RELEASE Bilateral    [place], 1996   COLONOSCOPY  11/02/2017   Dr Noberto Retort   ESOPHAGOGASTRODUODENOSCOPY  01/13/2015   Hiatal hernia. Mild gastrtiis.    REPLACEMENT TOTAL KNEE Right 03/2018   RESECTION OF RETROPERITONEAL MASS Bilateral 10/10/2018   Procedure: RESECTION OF RETROPERITONEAL MASS Take Down of Splenic Flexure;  Surgeon: [name], MD;  Location: [place];  Service: General;  Laterality: Bilateral;   RETROPERITONEAL MASS EXCISION  10/10/2018   Excision of malignant retroperitoneal mass, 23 cm in greatest dimension; takedown of splenic flexure   TONSILLECTOMY      Family History  Problem Relation Age of Onset   Breast cancer Mother    Kidney cancer Mother    Diabetes Father    Heart disease Father    Colon cancer Neg Hx    Esophageal cancer Neg Hx      Social History   Tobacco Use   Smoking status: Never   Smokeless tobacco: Never  Vaping Use   Vaping Use: Never used  Substance Use Topics   Alcohol use: No   Drug use: No    Current Outpatient Medications  Medication Sig Dispense Refill   acetaminophen (TYLENOL) 325 MG tablet Take 650 mg by mouth as needed.     ALPRAZolam (XANAX) 1 MG tablet Take 0.5 mg by mouth daily as needed for anxiety.      famotidine (PEPCID) 40 MG tablet Take one tablet by mouth at bedtime during flare-ups as directed. (Patient taking differently: Take 40 mg by mouth daily as needed. Take one tablet by mouth at bedtime during flare-ups as directed.) 30 tablet 5   mirtazapine (REMERON) 30 MG tablet Take 30 mg by mouth at bedtime.     pantoprazole (PROTONIX) 40 MG tablet TAKE 1 TABLET TWICE A DAY 60 tablet 5   rosuvastatin (CRESTOR) 5 MG tablet Take 5 mg by mouth 2 (two) times a week.     MOUNJARO 12.5 MG/0.5ML Pen Inject 12.5 mg into the skin once a week.     Multiple Vitamin (MULTIVITAMIN WITH MINERALS) TABS tablet Take 1 tablet by mouth daily.  ondansetron (ZOFRAN) 4 MG tablet Take 4 mg by mouth as needed.     Current Facility-Administered Medications  Medication Dose Route Frequency Provider Last Rate Last Admin   0.9 %  sodium chloride infusion  500 mL Intravenous Continuous Jackquline Denmark, MD        Allergies  Allergen Reactions   Bupropion Other (See Comments)    "cried very easily"   Buspar [Buspirone]    Sulfa Antibiotics     UNSPECIFIED REACTION    Sulfamethoxazole Rash   Topamax [Topiramate] Other (See Comments)    "mental fogginess"    Review of Systems:  neg     Physical Exam:    BP 112/70   Pulse (!) 103   Temp 97.7 F (36.5 C) (Temporal)   Ht '5\' 2"'$  (1.575 m)   Wt 199 lb (90.3 kg)   SpO2 96%   BMI 36.40 kg/m  Filed Weights   05/25/22 0909  Weight: 199 lb (90.3 kg)   Constitutional:  Well-developed, in no acute distress. Psychiatric: Normal mood and affect.  Behavior is normal. HEENT: Pupils normal.  Conjunctivae are normal. No scleral icterus.  Positive for pallor. Cardiovascular: Normal rate, regular rhythm. No edema Pulmonary/chest: Effort normal and breath sounds normal. No wheezing, rales or rhonchi. Abdominal: Soft, nondistended. Nontender. Bowel sounds active throughout. There are no masses palpable. No hepatomegaly.  Has well-healed surgical scar.  Has developing keloid. Rectal:  defered Neurological: Alert and oriented to person place and time. Skin: Skin is warm and dry. No rashes noted.   Carmell Austria, MD 05/25/2022, 9:53 AM  Cc: Ernestene Kiel, MD

## 2022-05-25 NOTE — Patient Instructions (Addendum)
  Nothing by mouth until 11:15, clear liquids until 12:15, soft foods for the rest of today.  Continue present medications including Protonix 40 mg daily.   YOU HAD AN ENDOSCOPIC PROCEDURE TODAY AT [facility]:   Refer to the procedure report that was given to you for any specific questions about what was found during the examination.  If the procedure report does not answer your questions, please call your gastroenterologist to clarify.  If you requested that your care partner not be given the details of your procedure findings, then the procedure report has been included in a sealed envelope for you to review at your convenience later.  YOU SHOULD EXPECT: Some feelings of bloating in the abdomen. Passage of more gas than usual.  Walking can help get rid of the air that was put into your GI tract during the procedure and reduce the bloating. If you had a lower endoscopy (such as a colonoscopy or flexible sigmoidoscopy) you may notice spotting of blood in your stool or on the toilet paper. If you underwent a bowel prep for your procedure, you may not have a normal bowel movement for a few days.  Please Note:  You might notice some irritation and congestion in your nose or some drainage.  This is from the oxygen used during your procedure.  There is no need for concern and it should clear up in a day or so.  SYMPTOMS TO REPORT IMMEDIATELY:  Following upper endoscopy (EGD)  Vomiting of blood or coffee ground material  New chest pain or pain under the shoulder blades  Painful or persistently difficult swallowing  New shortness of breath  Fever of 100F or higher  Black, tarry-looking stools  For urgent or emergent issues, a gastroenterologist can be reached at any hour by calling [phone]. Do not use MyChart messaging for urgent concerns.    DIET:  Nothing by mouth until 11:15, clear liquids until 12:15, soft foods for the rest of today.  Tomorrow you may proceed to your  regular diet.  Drink plenty of fluids but you should avoid alcoholic beverages for 24 hours.  ACTIVITY:  You should plan to take it easy for the rest of today and you should NOT DRIVE or use heavy machinery until tomorrow (because of the sedation medicines used during the test).    FOLLOW UP: Our staff will call the number listed on your records the next business day following your procedure.  We will call around 7:15- 8:00 am to check on you and address any questions or concerns that you may have regarding the information given to you following your procedure. If we do not reach you, we will leave a message.     If any biopsies were taken you will be contacted by phone or by letter within the next 1-3 weeks.  Please call us at [phone] if you have not heard about the biopsies in 3 weeks.    SIGNATURES/CONFIDENTIALITY: You and/or your care partner have signed paperwork which will be entered into your electronic medical record.  These signatures attest to the fact that that the information above on your After Visit Summary has been reviewed and is understood.  Full responsibility of the confidentiality of this discharge information lies with you and/or your care-partner.

## 2022-05-25 NOTE — Progress Notes (Signed)
A/ox3, pleased with MAC, report to RN, vomited small amt of brown liquid, raspy cough

## 2022-05-25 NOTE — Progress Notes (Signed)
Called to room to assist during endoscopic procedure.  Patient ID and intended procedure confirmed with present staff. Received instructions for my participation in the procedure from the performing physician.  

## 2022-05-25 NOTE — Op Note (Signed)
[place] Patient Name: Brittany Lee Procedure Date: 05/25/2022 9:50 AM MRN: [id number] Endoscopist: Jackquline Denmark , MD, [id number] Age: 56 Referring MD:  Date of Birth: 04/16/1966 Gender: Female Account #: [id number] Procedure:                Upper GI endoscopy Indications:              Dysphagia. GERD Medicines:                Monitored Anesthesia Care Procedure:                Pre-Anesthesia Assessment:                           - Prior to the procedure, a History and Physical                            was performed, and patient medications and                            allergies were reviewed. The patient's tolerance of                            previous anesthesia was also reviewed. The risks                            and benefits of the procedure and the sedation                            options and risks were discussed with the patient.                            All questions were answered, and informed consent                            was obtained. Prior Anticoagulants: The patient has                            taken no anticoagulant or antiplatelet agents. ASA                            Grade Assessment: II - A patient with mild systemic                            disease. After reviewing the risks and benefits,                            the patient was deemed in satisfactory condition to                            undergo the procedure.                           After obtaining informed consent, the endoscope was  passed under direct vision. Throughout the                            procedure, the patient's blood pressure, pulse, and                            oxygen saturations were monitored continuously. The                            GIF [id number] #[id number] was introduced through the                            mouth, and advanced to the second part of duodenum.                            The upper GI endoscopy was accomplished  without                            difficulty. The patient tolerated the procedure                            well. Scope In: Scope Out: Findings:                 The examined esophagus was mildly tortuous but                            normal. Biopsies were obtained from the proximal                            and distal esophagus with cold forceps for                            histology to r/o eosinophilic esophagitis.                           A Schatzki ring was found at the gastroesophageal                            junction, 32 cm from the incisors with luminal                            diameter of 14 mm. The scope was withdrawn.                            Dilation was performed with a Maloney dilator with                            mild resistance at 50 Fr and 52 Fr.                           A 3 cm hiatal hernia was present.  Food (residue) was found in the gastric body. This                            did limit examination to some extent. No outlet                            obstruction.                           Localized mild inflammation characterized by                            erythema was found in the gastric antrum. Biopsies                            were taken with a cold forceps for histology.                           The examined duodenum was normal. Complications:            No immediate complications. Estimated Blood Loss:     Estimated blood loss: none. Impression:               - Schatzki ring. Dilated.                           - 3 cm hiatal hernia.                           - Retained food without outlet obstruction.                           - Gastritis. Biopsied. Recommendation:           - Patient has a contact number available for                            emergencies. The signs and symptoms of potential                            delayed complications were discussed with the                            patient. Return to  normal activities tomorrow.                            Written discharge instructions were provided to the                            patient.                           - Post dilatation diet.                           - Continue present medications including Protonix  40 mg p.o. daily.                           - Await pathology results.                           - The findings and recommendations were discussed                            with the patient's family. If any problems in                            future, would consider GES. Jackquline Denmark, MD 05/25/2022 10:19:02 AM This report has been signed electronically.

## 2022-05-26 ENCOUNTER — Telehealth: Payer: Self-pay | Admitting: *Deleted

## 2022-05-26 NOTE — Telephone Encounter (Signed)
  Follow up Call-     05/25/2022    9:09 AM  Call back number  Post procedure Call Back phone  # [phone]  Permission to leave phone message Yes     Patient questions:  Do you have a fever, pain , or abdominal swelling? No. Pain Score  0 *  Have you tolerated food without any problems? Yes.    Have you been able to return to your normal activities? Yes.    Do you have any questions about your discharge instructions: Diet   No. Medications  No. Follow up visit  No.  Do you have questions or concerns about your Care? No.  Actions: * If pain score is 4 or above: No action needed, pain <4.

## 2022-05-31 ENCOUNTER — Ambulatory Visit
Admission: RE | Admit: 2022-05-31 | Discharge: 2022-05-31 | Disposition: A | Discharge status: Home / Self Care | Payer: Managed Care, Other (non HMO) | Source: Ambulatory Visit | Attending: Internal Medicine | Admitting: Internal Medicine

## 2022-05-31 DIAGNOSIS — Z1231 Encounter for screening mammogram for malignant neoplasm of breast: Secondary | ICD-10-CM

## 2022-06-04 ENCOUNTER — Encounter: Payer: Self-pay | Admitting: Gastroenterology

## 2023-06-22 ENCOUNTER — Ambulatory Visit: Payer: Managed Care, Other (non HMO) | Admitting: Gastroenterology

## 2023-07-02 ENCOUNTER — Telehealth: Payer: Self-pay | Admitting: Gastroenterology

## 2023-07-02 NOTE — Telephone Encounter (Signed)
Inbound call from patient stating she has been constipated and has not had a bowel movement in 9 days. Requesting a call back to discuss further and options to help. Please advise, thank you.

## 2023-07-02 NOTE — Telephone Encounter (Signed)
Pt stated that she has not had a BM in 9 days.  Pt was recommended to try Miralax twice a day until a large BM and then once a day.. Pt verbalized understanding with all questions answered.

## 2023-09-16 ENCOUNTER — Telehealth (HOSPITAL_BASED_OUTPATIENT_CLINIC_OR_DEPARTMENT_OTHER): Payer: Self-pay | Admitting: *Deleted

## 2023-09-16 ENCOUNTER — Encounter (HOSPITAL_BASED_OUTPATIENT_CLINIC_OR_DEPARTMENT_OTHER): Payer: Self-pay | Admitting: Emergency Medicine

## 2023-09-16 ENCOUNTER — Ambulatory Visit (HOSPITAL_BASED_OUTPATIENT_CLINIC_OR_DEPARTMENT_OTHER): Admission: EM | Admit: 2023-09-16 | Discharge: 2023-09-16 | Disposition: A | Discharge status: Home / Self Care

## 2023-09-16 DIAGNOSIS — H18821 Corneal disorder due to contact lens, right eye: Secondary | ICD-10-CM | POA: Diagnosis not present

## 2023-09-16 MED ORDER — ERYTHROMYCIN 5 MG/GM OP OINT: TOPICAL_OINTMENT | OPHTHALMIC | 0 refills | Status: DC

## 2023-09-16 NOTE — ED Triage Notes (Signed)
 Patient presents with c/o right eye discomfort and clear drainage since yesterday. Patient states she was attempting to put her contact in and her eye started burning. Patient also states the right eye is sensitive to light.

## 2023-09-16 NOTE — ED Provider Notes (Signed)
 [facility]    CSN: [id number] Arrival date & time: 09/16/23  0826      History   Chief Complaint Chief Complaint  Patient presents with   Eye Drainage   Eye Pain    HPI Brittany Lee is a 58 y.o. female.   58 year old female presents with approximately 2 days of right eye irritation, and clear drainage, light sensitivity.  Started after placing in her contact.  Mild blurry vision.  Denies any pain around the eye, headache or any other associated symptoms.  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 Mild gastrtiis.    REPLACEMENT TOTAL KNEE Right 03/2018   RESECTION OF RETROPERITONEAL MASS Bilateral 10/10/2018   Procedure: RESECTION OF RETROPERITONEAL MASS Take Down of Splenic Flexure;  Surgeon: Almond Lint, MD;  Location: [facility] OR;  Service: General;  Laterality: Bilateral;   RETROPERITONEAL MASS EXCISION  10/10/2018   Excision of malignant retroperitoneal mass, 23 cm in greatest dimension; takedown of splenic flexure   TONSILLECTOMY       OB History   No obstetric history on file.      Home Medications    Prior to Admission medications   Medication Sig Start Date End Date Taking? Authorizing Provider  acetaminophen (TYLENOL) 325 MG tablet Take 650 mg by mouth as needed.    [provider]  ALPRAZolam Prudy Feeler) 1 MG tablet Take 0.5 mg by mouth daily as needed for anxiety.  08/13/17   [provider]  erythromycin ophthalmic ointment Place a 1/2 inch ribbon of ointment into the lower eyelid. 09/16/23   Mardella Layman, MD  famotidine (PEPCID) 40 MG tablet Take one tablet by mouth at bedtime during flare-ups as directed. Patient taking differently: Take 40 mg by mouth daily as needed. Take one tablet by mouth at bedtime during flare-ups as directed. 01/23/19   Kozlow, Alvira Philips, MD  mirtazapine (REMERON) 30 MG tablet Take 30 mg by mouth at bedtime. 05/17/21   [provider]  MOUNJARO 12.5 MG/0.5ML Pen Inject 12.5 mg into the skin once a week. 12/15/21   [provider]  Multiple Vitamin (MULTIVITAMIN WITH MINERALS) TABS tablet Take 1 tablet by mouth daily.    [provider]  rosuvastatin (CRESTOR) 5 MG tablet Take 5 mg by mouth 2 (two) times a week.    [provider]    Family History Family History  Problem  Relation Age of Onset   Breast cancer Mother    Kidney cancer Mother    Diabetes Father    Heart disease Father    Colon cancer Neg Hx    Esophageal cancer Neg Hx     Social History Social History   Tobacco Use   Smoking status: Never   Smokeless tobacco: Never  Vaping Use   Vaping status: Never Used  Substance Use Topics   Alcohol use: No   Drug use: No     Allergies   Bupropion, Buspar [buspirone], Sulfa antibiotics, Sulfamethoxazole, and Topamax [topiramate]   Review of Systems Review of Systems  Eyes:  Positive for photophobia, pain and discharge.     Physical Exam Triage Vital Signs ED Triage Vitals [09/16/23 0837]  Encounter Vitals Group      BP 139/84     Systolic BP Percentile      Diastolic BP Percentile      Pulse Rate 90     Resp 18     Temp 98.1 F (36.7 C)     Temp Source Oral     SpO2 96 %     Weight      Height      Head Circumference      Peak Flow      Pain Score 5     Pain Loc      Pain Education      Exclude from Growth Chart    No data found.  Updated Vital Signs BP 139/84 (BP Location: Right Arm)   Pulse 90   Temp 98.1 F (36.7 C) (Oral)   Resp 18   SpO2 96%   Visual Acuity Right Eye Distance: 20/30 (with correction) Left Eye Distance: 20/20 (with correction) Bilateral Distance: 20/13 (with correction)  Right Eye Near:   Left Eye Near:    Bilateral Near:     Physical Exam HENT:     Head: Normocephalic and atraumatic.     Nose: Nose normal.  Eyes:     General: Lids are normal. Lids are everted, no foreign bodies appreciated. Vision grossly intact.        Right eye: No foreign body, discharge or hordeolum.     Conjunctiva/sclera:     Right eye: Right conjunctiva is injected.     Pupils: Pupils are equal, round, and reactive to light.     Comments: Mild right eye irritation, redness.  EOM intact.  Nontender to periorbital area.  No swelling  Neurological:     Mental Status: She is alert.      UC Treatments / Results  Labs (all labs ordered are listed, but only abnormal results are displayed) Labs Reviewed - No data to display  EKG   Radiology No results found.  Procedures Procedures (including critical care time)  Medications Ordered in UC Medications - No data to display  Initial Impression / Assessment and Plan / UC Course  I have reviewed the triage vital signs and the nursing notes.  Pertinent labs & imaging results that were available during my care of the patient were reviewed by me and considered in my medical decision making (see chart for details).     Corneal abrasion-most likely diagnosis based on exam and symptoms.  Exam showed mild injection to  inner eye but no obvious foreign bodies.  Recommend erythromycin ointment as prescribed and eyepatch.  Can follow-up with ophthalmology if symptoms continue or worsen. Final Clinical Impressions(s) / UC Diagnoses   Final diagnoses:  Corneal abrasion of right eye due to contact lens     Discharge Instructions       I believe that you have a corneal abrasion.  I am prescribing a erythromycin ointment for you to use.  Please follow-up with your primary care or ophthalmology for any continued issues    ED Prescriptions   None    PDMP not reviewed this encounter.   Janace Aris, FNP 09/16/23 [phone]

## 2023-09-16 NOTE — Telephone Encounter (Signed)
 E-prescribing medication from today's visit.

## 2023-09-16 NOTE — Discharge Instructions (Signed)
 I believe that you have a corneal abrasion.  I am prescribing a erythromycin ointment for you to use.  Please follow-up with your primary care or ophthalmology for any continued issues

## 2024-05-22 ENCOUNTER — Other Ambulatory Visit: Payer: Self-pay | Admitting: Medical Genetics

## 2024-05-26 ENCOUNTER — Ambulatory Visit: Admitting: Gastroenterology

## 2024-05-26 ENCOUNTER — Encounter: Payer: Self-pay | Admitting: Gastroenterology

## 2024-05-26 VITALS — BP 130/76 | HR 83 | Ht 63.0 in | Wt 167.5 lb

## 2024-05-26 DIAGNOSIS — K5909 Other constipation: Secondary | ICD-10-CM

## 2024-05-26 DIAGNOSIS — K219 Gastro-esophageal reflux disease without esophagitis: Secondary | ICD-10-CM

## 2024-05-26 DIAGNOSIS — K449 Diaphragmatic hernia without obstruction or gangrene: Secondary | ICD-10-CM

## 2024-05-26 MED ORDER — PANTOPRAZOLE SODIUM 40 MG PO TBEC: 40.0000 mg | DELAYED_RELEASE_TABLET | Freq: Every day | ORAL | 4 refills | Status: AC

## 2024-05-26 NOTE — Progress Notes (Signed)
 Chief Complaint: FU  Referring Provider:  Jefferey Fitch, MD      ASSESSMENT AND PLAN;   #1. GERD with HH.  #2. 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 No recurrence on recent CT chest/A/P 02/2024.  Being followed at [facility].  #3. Chronic constipation on Mounjaro. Neg colon 2019 as below   Plan: -High fiber diet -Miralax 17g po BID until large BM, then prn -Colace 3 tab po every day with 8oz H2O -protonix  40mg  po QD #90, 4 RF -Continue pepcid  20mg  po at bedtime -Rpt EGD with dil PRN.   HPI:    Brittany Lee is a 58 y.o. female  With well-differentiated liposarcoma s/p resection x 2 with left partial colectomy, left nephrectomy (most recently 02/2021). Neg CT C/A/P 02/2022 for recurrrence.  For FU and protonix  refill  History of Present Illness   She experiences a burning sensation starting in her chest, which has worsened over the last couple of months. The sensation is not as severe as before her previous esophageal dilation. She takes Protonix  several times a week and Pepcid  every night, which has helped reduce nighttime symptoms, but she still experiences acid reflux in the morning. No nighttime sickness is reported.  She is on Mounjaro, which she believes contributes to her constipation. She experienced a significant episode where she did not have a bowel movement for nine days. She uses Colace daily and has increased her fiber intake to manage this issue. Her constipation was less severe when she temporarily stopped Mounjaro.  She has a history of a hiatal hernia, which she believes contributes to her reflux symptoms. She has lost 32 pounds recently, attributed to her current medication regimen and dietary changes. Despite the weight loss, she still experiences some abdominal discomfort.  No melena or hematochezia.    Wt Readings from Last 3 Encounters:   05/26/24 167 lb 8 oz (76 kg)  05/25/22 199 lb (90.3 kg)  05/24/22 199 lb 2 oz (90.3 kg)      Past GI procedures: -EGD with dil 05/2022 - Schatzki ring. Dilated to 52Fr. - 3 cm hiatal hernia. - Retained food without outlet obstruction. - Gastritis. Biopsied. - Bx: Eso bx neg for EoE, gastric bx neg for HP  -EGD 01/13/2015:  3 cm hiatal hernia, mild gastritis. EGD with dil 11/2011-transient Schatzki's ring s/p dil -Colon 10/2017- neg (Dr Bert) except small hyperplastic polyp. rpt 10 yrs.  CT CAP 03/12/2024 with 3D-MIPS @ [facility] No CT evidence of recurrent or metastatic disease in the chest, abdomen, or  pelvis.   CT AP 04/13/2021 1.  Interval resection of left retroperitoneal sarcoma, left nephrectomy,  left adrenalectomy, and partial colectomy. Small volume free fluid in the  left upper quadrant and pelvis is likely postsurgical.  2.  Mild wall thickening of the transverse and proximal descending colon  which may also be reactive versus a mild nonspecific colitis.  3.  Peripheral wedge-shaped area of hypoenhancement in the spleen which may  represent a splenic infarct versus postsurgical change.  4.  Moderate left pleural effusion. CT AP 04/2021 1. Left-sided colonic inflammatory or infectious process. 2. Small amount of free abdominal and free pelvic fluid without definite peritoneal enhancement to suggest abscess or peritonitis. 3. Status post left nephrectomy. 4. Small focal triangular area of decreased perfusion involving the anterior lower aspect of the spleen most likely a small splenic infarct.   CT  02/2022 Chest/A/P 1.  Fluid density rounded focus in the anterior abdomen measuring up to 1.9  cm, previously subcentimeter. Findings are indeterminate, possibly evolving  post-surgical change, however other post-surgical changes have improved  compared to prior exam. Attention on short-term follow-up CT.  2.  Mild haziness in the fat of the right anterior mesentery, favored   post-operative change. Continued attention on follow-up    Oncology history WDLS of the retroperitoneum: --09/2018 presented with abdominal distention and early satiety x 3 months --09/2018 MRI reportedly showed 23 x 14 x 21 cm left RP mass c/f RP liposarcoma --09/2018 OR (Dr. Jina Nephew): excision of RP mass, 23 cm. Clear margins. --10/2019 CT A/P: Stable exam. No new or progressive interval findings to suggest recurrent or metastatic disease. The plaque-like focus of postoperative soft tissue attenuation along the anterior lower pole left kidney is unchanged.  --06/2020 CT A/P: Increasing volume of fat in the LEFT upper quadrant displacing bowel loops with subtle soft tissue without frankly nodular features but with subtle thickening of fascial planes best appreciated on coronal images, concerning for disease recurrence, up lifting pancreas and displacing bowel loops in the LEFT upper quadrant. Comparison particularly with imaging from July of 2020 shows increase in fat density in this area. --09/04/2020 MRI abd: Similar findings on MRI today as discussed on the CT abdomen/pelvis report of 06/18/2020. Fat density in the region of liposarcoma resection appears minimally progressive since  06/16/2019. However, no obvious enhancing soft tissue nodularity or soft tissue elements within this region.  --09/20/2020 CT guided core needle biopsy: WDLS --10/15/2020 PET: No significant metabolic activity associated with the fatty mass in the retroperitoneum ventral to the LEFT kidney. Particular attention directed to soft tissue thickening ventral LEFT kidney which was biopsied. No clear hypermetabolic activity. No evidence disease elsewhere FDG PET imaging. Small pulmonary nodule LEFT lower lobe is favored benign.  --11/17/2020 Initial consult with Dr. Claude and Dr. Elester --6/1-01/20/2021 IMRT to left abdomen (Dr. Elester) --02/02/2021 CT CAP: Increasing size of lobulated fat density in LUQ (19.1 x 10 x 21.2  cm) --03/07/2021 OR for RPS resection with en bloc partial colectomy and left nephrectomy  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   Osteoarthritis of knee    Pneumonia    Type 2 HSV infection of vulvovaginal region    UTI (urinary tract infection)     Past Surgical History:  Procedure Laterality Date   APPENDECTOMY  2012   CARPAL TUNNEL RELEASE Bilateral    CESAREAN SECTION  1995, 1996   COLONOSCOPY  11/02/2017   Dr Bert   ESOPHAGOGASTRODUODENOSCOPY  01/13/2015   Hiatal hernia. Mild gastrtiis.    REPLACEMENT TOTAL KNEE Right 03/2018   RESECTION OF RETROPERITONEAL MASS Bilateral 10/10/2018   Procedure: RESECTION OF RETROPERITONEAL MASS Take Down of Splenic Flexure;  Surgeon: Nephew Jina, MD;  Location: [facility] OR;  Service: General;  Laterality: Bilateral;   RETROPERITONEAL MASS EXCISION  10/10/2018   Excision of malignant retroperitoneal mass, 23 cm in greatest dimension; takedown of splenic flexure   TONSILLECTOMY      Family History  Problem Relation Age of Onset   Breast  cancer Mother    Kidney cancer Mother    Diabetes Father    Heart disease Father    Colon cancer Neg Hx    Esophageal cancer Neg Hx     Social History   Tobacco Use   Smoking status: Never   Smokeless tobacco: Never  Vaping Use   Vaping status: Never Used  Substance Use Topics   Alcohol use: No   Drug use: No    Current Outpatient Medications  Medication Sig Dispense Refill   acetaminophen  (TYLENOL ) 325 MG tablet Take 650 mg by mouth as needed.     ALPRAZolam  (XANAX ) 1 MG tablet Take 0.5 mg by mouth daily as needed for anxiety.      famotidine  (PEPCID ) 40 MG tablet Take one tablet by mouth at bedtime during flare-ups as  directed. 30 tablet 5   icosapent Ethyl (VASCEPA) 1 g capsule Take 2 g by mouth 2 (two) times daily.     mirtazapine (REMERON) 30 MG tablet Take 30 mg by mouth at bedtime.     MOUNJARO 15 MG/0.5ML Pen Inject 15 mg into the skin once a week.     Multiple Vitamin (MULTIVITAMIN WITH MINERALS) TABS tablet Take 1 tablet by mouth daily.     ondansetron  (ZOFRAN ) 4 MG tablet Take 4 mg by mouth as needed for nausea.     pantoprazole  (PROTONIX ) 40 MG tablet Take 40 mg by mouth daily.     rosuvastatin (CRESTOR) 5 MG tablet Take 5 mg by mouth 2 (two) times a week.     No current facility-administered medications for this visit.    Allergies  Allergen Reactions   Bupropion Other (See Comments)    cried very easily   Buspar [Buspirone]    Sulfa Antibiotics     UNSPECIFIED REACTION    Sulfamethoxazole Rash   Topamax [Topiramate] Other (See Comments)    mental fogginess    Review of Systems:  neg     Physical Exam:    BP 130/76 (BP Location: Left Arm, Patient Position: Sitting, Cuff Size: Normal)   Pulse 83   Ht 5' 3 (1.6 m)   Wt 167 lb 8 oz (76 kg)   BMI 29.67 kg/m  Filed Weights   05/26/24 1434  Weight: 167 lb 8 oz (76 kg)   Constitutional:  Well-developed, in no acute distress. Psychiatric: Normal mood and affect. Behavior is normal. HEENT: Pupils normal.  Conjunctivae are normal. No scleral icterus.  Positive for pallor. Cardiovascular: Normal rate, regular rhythm. No edema Pulmonary/chest: Effort normal and breath sounds normal. No wheezing, rales or rhonchi. Abdominal: Soft, nondistended. Nontender. Bowel sounds active throughout. There are no masses palpable. No hepatomegaly.  Has well-healed surgical scar.  Has developing keloid. Rectal:  defered Neurological: Alert and oriented to person place and time. Skin: Skin is warm and dry. No rashes noted.   Anselm Bring, MD 05/26/2024, 2:50 PM  Cc: Jefferey Fitch, MD

## 2024-05-26 NOTE — Patient Instructions (Addendum)
 _______________________________________________________  If your blood pressure at your visit was 140/90 or greater, please contact your primary care physician to follow up on this.  _______________________________________________________  If you are age 57 or older, your body mass index should be between 23-30. Your Body mass index is 29.67 kg/m. If this is out of the aforementioned range listed, please consider follow up with your Primary Care Provider.  If you are age 56 or younger, your body mass index should be between 19-25. Your Body mass index is 29.67 kg/m. If this is out of the aformentioned range listed, please consider follow up with your Primary Care Provider.   ________________________________________________________  The [facility] providers would like to encourage you to use MYCHART to communicate with providers for non-urgent requests or questions.  Due to long hold times on the telephone, sending your provider a message by [facility] may be a faster and more efficient way to get a response.  Please allow 48 business hours for a response.  Please remember that this is for non-urgent requests.  _______________________________________________________  [facility] is using a team-based approach to care.  Your team is made up of your doctor and two to three APPS. Our APPS (Nurse Practitioners and Physician Assistants) work with your physician to ensure care continuity for you. They are fully qualified to address your health concerns and develop a treatment plan. They communicate directly with your gastroenterologist to care for you. Seeing the Advanced Practice Practitioners on your physician's team can help you by facilitating care more promptly, often allowing for earlier appointments, access to diagnostic testing, procedures, and other specialty referrals.   We have sent the following medications to your pharmacy for you to pick up at your convenience: Protonix     Continue pepcid  at night  Please purchase the following medications over the counter and take as directed: Colace 1 tablet daily with 8oz of water  Miralax 17g 2 times a day until large bowel movement and then as needed  Thank you,  Dr. Lynnie Bring

## 2024-06-13 ENCOUNTER — Encounter (HOSPITAL_BASED_OUTPATIENT_CLINIC_OR_DEPARTMENT_OTHER): Payer: Self-pay

## 2024-06-13 ENCOUNTER — Other Ambulatory Visit (HOSPITAL_BASED_OUTPATIENT_CLINIC_OR_DEPARTMENT_OTHER): Payer: Self-pay

## 2024-06-13 ENCOUNTER — Ambulatory Visit (HOSPITAL_BASED_OUTPATIENT_CLINIC_OR_DEPARTMENT_OTHER)
Admission: RE | Admit: 2024-06-13 | Discharge: 2024-06-13 | Disposition: A | Discharge status: Home / Self Care | Source: Ambulatory Visit | Attending: Family Medicine | Admitting: Family Medicine

## 2024-06-13 VITALS — BP 141/87 | HR 73 | Temp 98.4°F | Resp 20

## 2024-06-13 DIAGNOSIS — M79674 Pain in right toe(s): Secondary | ICD-10-CM | POA: Diagnosis not present

## 2024-06-13 DIAGNOSIS — M79671 Pain in right foot: Secondary | ICD-10-CM

## 2024-06-13 DIAGNOSIS — Z905 Acquired absence of kidney: Secondary | ICD-10-CM

## 2024-06-13 MED ORDER — PREDNISONE 20 MG PO TABS
ORAL_TABLET | ORAL | 0 refills | Status: AC
  Filled 2024-06-13: qty 9, 6d supply, fill #0

## 2024-06-13 NOTE — ED Provider Notes (Signed)
 [facility]    CSN: [id number] Arrival date & time: 06/13/24  1509      History   Chief Complaint Chief Complaint  Patient presents with   Foot Pain    Entered by patient    HPI Renell Coaxum is a 58 y.o. female.   58 year old female with complaint of right great toe and right foot pain since approximately 05/22/2024 or earlier.  She is not aware of any injury nor trauma.  Initially it was just mildly sore but its gotten increasingly more painful and on Wednesday, 06/11/2024 it began to just throb all the time.  She has not taken any medication for the symptoms.  She does only have 1 kidney.  She is not aware of any kidney disease beyond solitary kidney.  She has one small can of soda a day but otherwise she does not eat a lot of red meat or pork, salmon or tuna, shellfish, high fructose corn syrup.   Foot Pain Pertinent negatives include no chest pain and no abdominal pain.    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 Mild gastrtiis.    REPLACEMENT TOTAL KNEE Right 03/2018    RESECTION OF RETROPERITONEAL MASS Bilateral 10/10/2018   Procedure: RESECTION OF RETROPERITONEAL MASS Take Down of Splenic Flexure;  Surgeon: Aron Shoulders, MD;  Location: [facility] OR;  Service: General;  Laterality: Bilateral;   RETROPERITONEAL MASS EXCISION  10/10/2018   Excision of malignant retroperitoneal mass, 23 cm in greatest dimension; takedown of splenic flexure   TONSILLECTOMY      OB History   No obstetric history on file.      Home Medications    Prior to Admission medications   Medication Sig Start Date End Date Taking? Authorizing Provider  predniSONE  (DELTASONE ) 20 MG tablet Take 2 tablets (40 mg total) by mouth daily for 3 days, THEN 1 tablet (20 mg total) daily for 3 days. 06/13/24 06/19/24 Yes Ival Domino, FNP  acetaminophen  (TYLENOL ) 325 MG tablet Take 650 mg by mouth as needed.    [provider]  ALPRAZolam  (XANAX ) 1 MG tablet Take 0.5 mg by mouth daily as needed for anxiety.  08/13/17   [provider]  famotidine  (PEPCID ) 40 MG tablet Take one tablet by mouth at bedtime during flare-ups as directed. 01/23/19   Kozlow, Camellia PARAS, MD  icosapent Ethyl (VASCEPA) 1 g capsule Take 2 g by mouth 2 (two) times daily.    [provider]  mirtazapine (REMERON) 30 MG tablet Take 30 mg by mouth at bedtime. 05/17/21   [provider]  MOUNJARO 15 MG/0.5ML Pen Inject 15 mg into the skin once a week.    [provider]  Multiple Vitamin (MULTIVITAMIN WITH MINERALS) TABS tablet Take 1 tablet by mouth daily.    [provider]  ondansetron  (ZOFRAN ) 4 MG tablet Take 4 mg by mouth as needed for nausea.    [provider]  pantoprazole  (PROTONIX ) 40 MG tablet Take 1 tablet (40 mg total) by mouth daily. 05/26/24   Charlanne Groom, MD  rosuvastatin (CRESTOR) 5 MG tablet Take 5 mg by mouth 2 (two) times a week.    [provider]    Family History Family History  Problem Relation Age of Onset   Breast cancer Mother    Kidney  cancer Mother    Diabetes Father    Heart disease Father    Colon cancer Neg Hx    Esophageal cancer Neg Hx     Social History Social History   Tobacco Use   Smoking status: Never   Smokeless tobacco: Never  Vaping Use   Vaping status: Never Used  Substance Use Topics   Alcohol use: No   Drug use: No     Allergies   Bupropion, Buspar [buspirone], Sulfa antibiotics, Sulfamethoxazole, and Topamax [topiramate]   Review of Systems Review of Systems  Constitutional:  Negative for fever.  Respiratory:  Negative for cough.   Cardiovascular:  Negative for chest pain.  Gastrointestinal:  Negative for abdominal pain, constipation, diarrhea, nausea and vomiting.  Musculoskeletal:  Positive for joint swelling (Pain and swelling of right great toe and right forefoot medially.). Negative for arthralgias and back pain.  Skin:  Negative for color change and rash.  Neurological:  Negative for syncope.  All other systems reviewed and are negative.    Physical Exam Triage Vital Signs ED Triage Vitals  Encounter Vitals Group     BP 06/13/24 1544 (!) 141/87     Girls Systolic BP Percentile --      Girls Diastolic BP Percentile --      Boys Systolic BP Percentile --      Boys Diastolic BP Percentile --      Pulse Rate 06/13/24 1544 73     Resp 06/13/24 1544 20     Temp 06/13/24 1544 98.4 F (36.9 C)     Temp Source 06/13/24 1544 Oral     SpO2 06/13/24 1544 97 %     Weight --      Height --      Head Circumference --      Peak Flow --      Pain Score 06/13/24 1542 6     Pain Loc --      Pain Education --      Exclude from Growth Chart --    No data found.  Updated Vital Signs BP (!) 141/87 (BP Location: Right Arm)   Pulse 73   Temp 98.4 F (36.9 C) (Oral)   Resp 20   SpO2 97%   Visual Acuity Right Eye Distance:   Left Eye Distance:   Bilateral Distance:    Right Eye Near:   Left Eye Near:    Bilateral Near:     Physical Exam Vitals and nursing note reviewed.   Constitutional:      General: She is not in acute distress.    Appearance: She is well-developed. She is  not ill-appearing or toxic-appearing.  HENT:     Head: Normocephalic and atraumatic.     Right Ear: External ear normal.     Left Ear: External ear normal.     Nose: Nose normal.     Mouth/Throat:     Lips: Pink.     Mouth: Mucous membranes are moist.  Eyes:     Conjunctiva/sclera: Conjunctivae normal.     Pupils: Pupils are equal, round, and reactive to light.  Cardiovascular:     Rate and Rhythm: Normal rate and regular rhythm.     Heart sounds: S1 normal and S2 normal. No murmur heard. Pulmonary:     Effort: Pulmonary effort is normal. No respiratory distress.     Breath sounds: Normal breath sounds. No decreased breath sounds, wheezing, rhonchi or rales.  Musculoskeletal:        General: No swelling.     Right knee: Normal.     Left knee: Normal.     Right lower leg: Normal.     Left lower leg: Normal.     Right ankle: Normal.     Left ankle: Normal.     Right foot: Decreased range of motion (At the great toe due to pain). Normal capillary refill. Tenderness (At the great toe and at the forefoot medially) and bony tenderness (At the great toe and at the forefoot medially) present. No swelling, deformity, bunion, Charcot foot, foot drop, prominent metatarsal heads, laceration or crepitus. Normal pulse.     Left foot: Normal.     Comments: Right great toe and forefoot with erythema, warmth, swelling and tenderness.  Skin:    General: Skin is warm and dry.     Capillary Refill: Capillary refill takes less than 2 seconds.     Findings: No rash.  Neurological:     Mental Status: She is alert and oriented to person, place, and time.  Psychiatric:        Mood and Affect: Mood normal.      UC Treatments / Results  Labs (all labs ordered are listed, but only abnormal results are displayed) Labs Reviewed  CBC WITH DIFFERENTIAL/PLATELET  COMPREHENSIVE METABOLIC PANEL  WITH GFR  URIC ACID    EKG   Radiology No results found.  Procedures Procedures (including critical care time)  Medications Ordered in UC Medications - No data to display  Initial Impression / Assessment and Plan / UC Course  I have reviewed the triage vital signs and the nursing notes.  Pertinent labs & imaging results that were available during my care of the patient were reviewed by me and considered in my medical decision making (see chart for details).  Plan of Care: Right foot pain and great toe pain (probable gout): CBC with differential, CMP, uric acid drawn and sent.  Will adjust the plan of care, if needed once those test result.  Patient has insulin  resistance but does not have diabetes.  Prednisone 20 mg, #2 pills (40 mg dose) daily x 3 days, then take 20 mg, #1 pill (20 mg dose) daily x 3 days.  Encouraged ice and elevation.  Offered an x-ray of the foot but patient declines since she has had no trauma nor injury.  Patient does have a solitary kidney and that places her at higher risk for gout.  Provided education on gout and also a low purine diet.  Follow-up with primary care if symptoms do not improve, if symptoms worsen or if new symptoms occur.  Follow-up here  if needed.  I reviewed the plan of care with the patient and/or the patient's guardian.  The patient and/or guardian had time to ask questions and acknowledged that the questions were answered.  I provided instruction on symptoms or reasons to return here or to go to an ER, if symptoms/condition did not improve, worsened or if new symptoms occurred.  Final Clinical Impressions(s) / UC Diagnoses   Final diagnoses:  Right foot pain  Great toe pain, right  Solitary kidney, acquired     Discharge Instructions      Right foot pain and great toe pain (probable gout): CBC with differential, CMP, uric acid drawn and sent.  Will adjust the plan of care, if needed once those test result.  Patient has insulin   resistance but does not have diabetes.  Prednisone 20 mg, #2 pills (40 mg dose) daily x 3 days, then take 20 mg, #1 pill (20 mg dose) daily x 3 days.  Encouraged ice and elevation  Provided education on gout and also a low purine diet.  Follow-up with primary care if symptoms do not improve, if symptoms worsen or if new symptoms occur.  Follow-up here if needed.     ED Prescriptions     Medication Sig Dispense Auth. Provider   predniSONE (DELTASONE) 20 MG tablet Take 2 tablets (40 mg total) by mouth daily for 3 days, THEN 1 tablet (20 mg total) daily for 3 days. 9 tablet Atlee Villers, FNP      PDMP not reviewed this encounter.   Ival Domino, FNP 06/13/24 1601

## 2024-06-13 NOTE — Discharge Instructions (Addendum)
 Right foot pain and great toe pain (probable gout): CBC with differential, CMP, uric acid drawn and sent.  Will adjust the plan of care, if needed once those test result.  Patient has insulin  resistance but does not have diabetes.  Prednisone 20 mg, #2 pills (40 mg dose) daily x 3 days, then take 20 mg, #1 pill (20 mg dose) daily x 3 days.  Encouraged ice and elevation  Provided education on gout and also a low purine diet.  Follow-up with primary care if symptoms do not improve, if symptoms worsen or if new symptoms occur.  Follow-up here if needed.

## 2024-06-13 NOTE — ED Triage Notes (Signed)
 Pt c/o right great toe/foot pain for the last 2-3 weeks. Denies known injury. States in the beginning it was just a little sore but this week- around Wednesday the pain has increased. The pain is described as a constant throbbing. She has not taken anything for the pain. Pt reports she only has one kidney and tries not to take anything if she can help it.

## 2024-06-14 ENCOUNTER — Ambulatory Visit (HOSPITAL_BASED_OUTPATIENT_CLINIC_OR_DEPARTMENT_OTHER): Payer: Self-pay | Admitting: Family Medicine

## 2024-06-14 LAB — CBC WITH DIFFERENTIAL/PLATELET
Basophils Absolute: 0 x10E3/uL (ref 0.0–0.2)
Basos: 1 %
EOS (ABSOLUTE): 0.1 x10E3/uL (ref 0.0–0.4)
Eos: 2 %
Hematocrit: 36.7 % (ref 34.0–46.6)
Hemoglobin: 12.4 g/dL (ref 11.1–15.9)
Immature Grans (Abs): 0 x10E3/uL (ref 0.0–0.1)
Immature Granulocytes: 0 %
Lymphocytes Absolute: 1.8 x10E3/uL (ref 0.7–3.1)
Lymphs: 27 %
MCH: 31.5 pg (ref 26.6–33.0)
MCHC: 33.8 g/dL (ref 31.5–35.7)
MCV: 93 fL (ref 79–97)
Monocytes Absolute: 0.7 x10E3/uL (ref 0.1–0.9)
Monocytes: 10 %
Neutrophils Absolute: 4 x10E3/uL (ref 1.4–7.0)
Neutrophils: 60 %
Platelets: 293 x10E3/uL (ref 150–450)
RBC: 3.94 x10E6/uL (ref 3.77–5.28)
RDW: 13 % (ref 11.7–15.4)
WBC: 6.6 x10E3/uL (ref 3.4–10.8)

## 2024-06-14 LAB — COMPREHENSIVE METABOLIC PANEL WITH GFR
ALT: 19 IU/L (ref 0–32)
AST: 19 IU/L (ref 0–40)
Albumin: 4.3 g/dL (ref 3.8–4.9)
Alkaline Phosphatase: 82 IU/L (ref 49–135)
BUN/Creatinine Ratio: 16 (ref 9–23)
BUN: 16 mg/dL (ref 6–24)
Bilirubin Total: 0.2 mg/dL (ref 0.0–1.2)
CO2: 24 mmol/L (ref 20–29)
Calcium: 9.6 mg/dL (ref 8.7–10.2)
Chloride: 104 mmol/L (ref 96–106)
Creatinine, Ser: 0.99 mg/dL (ref 0.57–1.00)
Globulin, Total: 2.1 g/dL (ref 1.5–4.5)
Glucose: 91 mg/dL (ref 70–99)
Potassium: 4.4 mmol/L (ref 3.5–5.2)
Sodium: 141 mmol/L (ref 134–144)
Total Protein: 6.4 g/dL (ref 6.0–8.5)
eGFR: 67 mL/min/1.73 (ref >59)

## 2024-06-14 LAB — URIC ACID: Uric Acid: 6.8 mg/dL (ref 3.0–7.2)

## 2024-06-14 NOTE — Progress Notes (Signed)
 CBC with differential and CMP are normal.  Uric acid is elevated at 6.8 and this confirms the diagnosis of gout.  Continue the prednisone and follow-up as needed.  Encouraged to contact primary care and discuss gout as a new diagnosis.  Unable to speak to the patient personally but I was able to leave her a voicemail message.

## 2024-07-01 ENCOUNTER — Encounter (HOSPITAL_BASED_OUTPATIENT_CLINIC_OR_DEPARTMENT_OTHER): Payer: Self-pay

## 2024-07-01 ENCOUNTER — Other Ambulatory Visit (HOSPITAL_BASED_OUTPATIENT_CLINIC_OR_DEPARTMENT_OTHER): Payer: Self-pay

## 2024-07-01 ENCOUNTER — Ambulatory Visit (HOSPITAL_BASED_OUTPATIENT_CLINIC_OR_DEPARTMENT_OTHER)
Admission: RE | Admit: 2024-07-01 | Discharge: 2024-07-01 | Disposition: A | Discharge status: Home / Self Care | Source: Home / Self Care

## 2024-07-01 VITALS — BP 123/86 | HR 93 | Temp 98.6°F | Resp 20

## 2024-07-01 DIAGNOSIS — J0110 Acute frontal sinusitis, unspecified: Secondary | ICD-10-CM

## 2024-07-01 MED ORDER — AMOXICILLIN-POT CLAVULANATE 875-125 MG PO TABS
1.0000 | ORAL_TABLET | Freq: Two times a day (BID) | ORAL | 0 refills | Status: AC
  Filled 2024-07-01: qty 14, 7d supply, fill #0

## 2024-07-01 MED ORDER — PREDNISONE 20 MG PO TABS
40.0000 mg | ORAL_TABLET | Freq: Every day | ORAL | 0 refills | Status: AC
  Filled 2024-07-01: qty 10, 5d supply, fill #0

## 2024-07-01 NOTE — ED Provider Notes (Signed)
 [facility]    CSN: [id number] Arrival date & time: 07/01/24  1634      History   Chief Complaint Chief Complaint  Patient presents with   Cough    Entered by patient    HPI Brittany Lee is a 58 y.o. female.   Pt c/o cough-barky, nasal congestion, chest tightness, upper back tightness, facial pain, HA, nausea, body aches, low grade temp-99.8 for the last 4 days. She has taken tylenol  and tussin with slight relief. Reports she has had Kenalog inj in April and October.     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 Mild gastrtiis.    REPLACEMENT TOTAL KNEE Right 03/2018   RESECTION OF RETROPERITONEAL MASS Bilateral 10/10/2018   Procedure: RESECTION OF RETROPERITONEAL MASS Take Down of Splenic Flexure;  Surgeon: Aron Shoulders, MD;  Location: [facility] OR;  Service: General;  Laterality: Bilateral;   RETROPERITONEAL MASS EXCISION  10/10/2018   Excision of malignant retroperitoneal mass, 23 cm in greatest dimension; takedown of splenic flexure   TONSILLECTOMY      OB History   No  obstetric history on file.      Home Medications    Prior to Admission medications  Medication Sig Start Date End Date Taking? Authorizing Provider  amoxicillin -clavulanate (AUGMENTIN ) 875-125 MG tablet Take 1 tablet by mouth every 12 (twelve) hours. 07/01/24  Yes Donovyn Guidice A, FNP  predniSONE  (DELTASONE ) 20 MG tablet Take 2 tablets (40 mg total) by mouth daily with breakfast for 5 days. 07/01/24 07/06/24 Yes Pallas Wahlert A, FNP  acetaminophen  (TYLENOL ) 325 MG tablet Take 650 mg by mouth as needed.    [provider]  ALPRAZolam  (XANAX ) 1 MG tablet Take 0.5 mg by mouth daily as needed for anxiety.  08/13/17   [provider]  famotidine  (PEPCID ) 40 MG tablet Take one tablet by mouth at bedtime during flare-ups as directed. 01/23/19   Kozlow, Camellia PARAS, MD  icosapent Ethyl (VASCEPA) 1 g capsule Take 2 g by mouth 2 (two) times daily.    [provider]  mirtazapine (REMERON) 30 MG tablet Take 30 mg by mouth at bedtime. 05/17/21   [provider]  MOUNJARO 15 MG/0.5ML Pen Inject 15 mg into the skin once a week.    [provider]  Multiple Vitamin (MULTIVITAMIN WITH MINERALS) TABS tablet Take 1 tablet by mouth daily.    [provider]  ondansetron  (ZOFRAN ) 4 MG tablet  Take 4 mg by mouth as needed for nausea.    [provider]  pantoprazole  (PROTONIX ) 40 MG tablet Take 1 tablet (40 mg total) by mouth daily. 05/26/24   Charlanne Groom, MD  rosuvastatin (CRESTOR) 5 MG tablet Take 5 mg by mouth 2 (two) times a week.    [provider]    Family History Family History  Problem Relation Age of Onset   Breast cancer Mother    Kidney cancer Mother    Diabetes Father    Heart disease Father    Colon cancer Neg Hx    Esophageal cancer Neg Hx     Social History Social History[1]   Allergies   Bupropion, Buspar [buspirone], Sulfa antibiotics, Sulfamethoxazole, and Topamax [topiramate]   Review of Systems Review of Systems   Respiratory:  Positive for cough.      Physical Exam Triage Vital Signs ED Triage Vitals  Encounter Vitals Group     BP 07/01/24 1720 123/86     Girls Systolic BP Percentile --      Girls Diastolic BP Percentile --      Boys Systolic BP Percentile --      Boys Diastolic BP Percentile --      Pulse Rate 07/01/24 1720 93     Resp 07/01/24 1720 20     Temp 07/01/24 1720 98.6 F (37 C)     Temp Source 07/01/24 1720 Oral     SpO2 07/01/24 1720 95 %     Weight --      Height --      Head Circumference --      Peak Flow --      Pain Score 07/01/24 1719 6     Pain Loc --      Pain Education --      Exclude from Growth Chart --    No data found.  Updated Vital Signs BP 123/86 (BP Location: Right Arm)   Pulse 93   Temp 98.6 F (37 C) (Oral)   Resp 20   SpO2 95%   Visual Acuity Right Eye Distance:   Left Eye Distance:   Bilateral Distance:    Right Eye Near:   Left Eye Near:    Bilateral Near:     Physical Exam Constitutional:      General: She is not in acute distress.    Appearance: Normal appearance. She is not ill-appearing, toxic-appearing or diaphoretic.  HENT:     Head: Normocephalic and atraumatic.     Right Ear: Tympanic membrane and ear canal normal.     Left Ear: Tympanic membrane and ear canal normal.     Nose: Congestion present.     Mouth/Throat:     Pharynx: Oropharynx is clear.  Eyes:     Conjunctiva/sclera: Conjunctivae normal.  Cardiovascular:     Rate and Rhythm: Normal rate and regular rhythm.     Pulses: Normal pulses.     Heart sounds: Normal heart sounds.  Pulmonary:     Effort: Pulmonary effort is normal.     Breath sounds: Rhonchi present.  Skin:    General: Skin is warm and dry.  Neurological:     Mental Status: She is alert.  Psychiatric:        Mood and Affect: Mood normal.      UC Treatments / Results  Labs (all labs ordered are listed, but only abnormal results are displayed) Labs Reviewed - No data to  display  EKG  Radiology No results found.  Procedures Procedures (including critical care time)  Medications Ordered in UC Medications - No data to display  Initial Impression / Assessment and Plan / UC Course  I have reviewed the triage vital signs and the nursing notes.  Pertinent labs & imaging results that were available during my care of the patient were reviewed by me and considered in my medical decision making (see chart for details).     Acute sinusitis-medications as prescribed.  Over-the-counter medications as needed.  Follow-up as needed Final Clinical Impressions(s) / UC Diagnoses   Final diagnoses:  Acute non-recurrent frontal sinusitis   Discharge Instructions   None    ED Prescriptions     Medication Sig Dispense Auth. Provider   predniSONE  (DELTASONE ) 20 MG tablet Take 2 tablets (40 mg total) by mouth daily with breakfast for 5 days. 10 tablet Torian Thoennes A, FNP   amoxicillin -clavulanate (AUGMENTIN ) 875-125 MG tablet Take 1 tablet by mouth every 12 (twelve) hours. 14 tablet Adah Wilbert LABOR, FNP      PDMP not reviewed this encounter.     [1]  Social History Tobacco Use   Smoking status: Never   Smokeless tobacco: Never  Vaping Use   Vaping status: Never Used  Substance Use Topics   Alcohol use: No   Drug use: No     Adah Wilbert LABOR, FNP 07/01/24 1754

## 2024-07-01 NOTE — ED Triage Notes (Signed)
 Pt c/o cough-barky, nasal congestion, chest tightness, upper back tightness, facial pain, HA, nausea, body aches, low grade temp-99.8 for the last 4 days. She has taken tylenol  and tussin with slight relief. Reports she has had Kenalog inj in April and October.
